# Patient Record
Sex: Female | Born: 1935 | Race: White | Hispanic: No | Smoking: Never smoker
Health system: Southern US, Community
[De-identification: ages and names within clinical notes are randomized; demographics above are authoritative.]

## PROBLEM LIST (undated history)

## (undated) DIAGNOSIS — Z9889 Other specified postprocedural states: Secondary | ICD-10-CM

## (undated) DIAGNOSIS — I350 Nonrheumatic aortic (valve) stenosis: Secondary | ICD-10-CM

## (undated) DIAGNOSIS — L900 Lichen sclerosus et atrophicus: Secondary | ICD-10-CM

## (undated) DIAGNOSIS — R0600 Dyspnea, unspecified: Secondary | ICD-10-CM

## (undated) DIAGNOSIS — R0609 Other forms of dyspnea: Secondary | ICD-10-CM

## (undated) DIAGNOSIS — I83813 Varicose veins of bilateral lower extremities with pain: Secondary | ICD-10-CM

## (undated) DIAGNOSIS — N390 Urinary tract infection, site not specified: Secondary | ICD-10-CM

## (undated) DIAGNOSIS — R413 Other amnesia: Secondary | ICD-10-CM

## (undated) DIAGNOSIS — R112 Nausea with vomiting, unspecified: Secondary | ICD-10-CM

## (undated) DIAGNOSIS — G4700 Insomnia, unspecified: Secondary | ICD-10-CM

## (undated) DIAGNOSIS — Z8739 Personal history of other diseases of the musculoskeletal system and connective tissue: Secondary | ICD-10-CM

## (undated) DIAGNOSIS — G8929 Other chronic pain: Secondary | ICD-10-CM

## (undated) DIAGNOSIS — M1990 Unspecified osteoarthritis, unspecified site: Secondary | ICD-10-CM

## (undated) DIAGNOSIS — H269 Unspecified cataract: Secondary | ICD-10-CM

## (undated) DIAGNOSIS — E7800 Pure hypercholesterolemia, unspecified: Secondary | ICD-10-CM

## (undated) DIAGNOSIS — K219 Gastro-esophageal reflux disease without esophagitis: Secondary | ICD-10-CM

## (undated) DIAGNOSIS — Q438 Other specified congenital malformations of intestine: Secondary | ICD-10-CM

## (undated) DIAGNOSIS — M545 Low back pain, unspecified: Secondary | ICD-10-CM

## (undated) DIAGNOSIS — E039 Hypothyroidism, unspecified: Secondary | ICD-10-CM

## (undated) HISTORY — DX: Nonrheumatic aortic (valve) stenosis: I35.0

## (undated) HISTORY — DX: Other forms of dyspnea: R06.09

## (undated) HISTORY — DX: Other chronic pain: G89.29

## (undated) HISTORY — DX: Low back pain, unspecified: M54.50

## (undated) HISTORY — DX: Unspecified cataract: H26.9

## (undated) HISTORY — DX: Other amnesia: R41.3

## (undated) HISTORY — PX: ENDOSCOPIC VEIN LASER TREATMENT: SHX1508

## (undated) HISTORY — DX: Dyspnea, unspecified: R06.00

## (undated) HISTORY — DX: Hypothyroidism, unspecified: E03.9

## (undated) HISTORY — DX: Unspecified osteoarthritis, unspecified site: M19.90

## (undated) HISTORY — DX: Varicose veins of bilateral lower extremities with pain: I83.813

## (undated) HISTORY — DX: Personal history of other diseases of the musculoskeletal system and connective tissue: Z87.39

## (undated) HISTORY — PX: FOOT NEUROMA SURGERY: SHX646

## (undated) HISTORY — DX: Gastro-esophageal reflux disease without esophagitis: K21.9

## (undated) HISTORY — PX: EYE MUSCLE SURGERY: SHX370

## (undated) HISTORY — DX: Other specified congenital malformations of intestine: Q43.8

## (undated) HISTORY — DX: Pure hypercholesterolemia, unspecified: E78.00

## (undated) HISTORY — DX: Lichen sclerosus et atrophicus: L90.0

## (undated) HISTORY — DX: Urinary tract infection, site not specified: N39.0

---
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
## 1974-05-13 HISTORY — PX: CHOLECYSTECTOMY: SHX55

## 1998-03-28 ENCOUNTER — Other Ambulatory Visit: Admission: RE | Admit: 1998-03-28 | Discharge: 1998-03-28 | Payer: Self-pay | Admitting: Obstetrics and Gynecology

## 1998-05-13 HISTORY — PX: TOTAL HIP ARTHROPLASTY: SHX124

## 1998-05-17 ENCOUNTER — Encounter: Payer: Self-pay | Admitting: Emergency Medicine

## 1998-05-18 ENCOUNTER — Inpatient Hospital Stay (HOSPITAL_COMMUNITY): Admission: EM | Admit: 1998-05-18 | Discharge: 1998-05-23 | Payer: Self-pay | Admitting: Emergency Medicine

## 1998-05-18 ENCOUNTER — Encounter: Payer: Self-pay | Admitting: Orthopedic Surgery

## 1998-05-25 ENCOUNTER — Encounter (HOSPITAL_COMMUNITY): Admission: RE | Admit: 1998-05-25 | Discharge: 1998-08-23 | Payer: Self-pay | Admitting: Orthopedic Surgery

## 1999-03-09 ENCOUNTER — Other Ambulatory Visit: Admission: RE | Admit: 1999-03-09 | Discharge: 1999-03-09 | Payer: Self-pay | Admitting: *Deleted

## 1999-03-29 ENCOUNTER — Encounter (INDEPENDENT_AMBULATORY_CARE_PROVIDER_SITE_OTHER): Payer: Self-pay

## 1999-03-29 ENCOUNTER — Other Ambulatory Visit: Admission: RE | Admit: 1999-03-29 | Discharge: 1999-03-29 | Payer: Self-pay | Admitting: *Deleted

## 2000-04-23 ENCOUNTER — Other Ambulatory Visit: Admission: RE | Admit: 2000-04-23 | Discharge: 2000-04-23 | Payer: Self-pay | Admitting: *Deleted

## 2002-12-06 ENCOUNTER — Other Ambulatory Visit: Admission: RE | Admit: 2002-12-06 | Discharge: 2002-12-06 | Payer: Self-pay | Admitting: Obstetrics & Gynecology

## 2003-12-28 ENCOUNTER — Ambulatory Visit (HOSPITAL_COMMUNITY): Admission: RE | Admit: 2003-12-28 | Discharge: 2003-12-28 | Payer: Self-pay | Admitting: Gastroenterology

## 2008-12-14 ENCOUNTER — Encounter: Admission: RE | Admit: 2008-12-14 | Discharge: 2008-12-14 | Payer: Self-pay | Admitting: Gastroenterology

## 2009-09-14 ENCOUNTER — Emergency Department (HOSPITAL_COMMUNITY): Admission: EM | Admit: 2009-09-14 | Discharge: 2009-09-14 | Payer: Self-pay | Admitting: Emergency Medicine

## 2009-11-14 ENCOUNTER — Encounter: Admission: RE | Admit: 2009-11-14 | Discharge: 2009-12-08 | Payer: Self-pay | Admitting: Orthopaedic Surgery

## 2010-09-28 NOTE — Op Note (Signed)
NAME:  Summer Davis, Summer Davis NO.:  [id number]   MEDICAL RECORD NO.:  [id number]                   PATIENT TYPE:  AMB   LOCATION:  ENDO                                 FACILITY:  [facility]   PHYSICIAN:  Graylin Shiver, M.D.                DATE OF BIRTH:  07/30/1930   DATE OF PROCEDURE:  12/28/2003  DATE OF DISCHARGE:                                 OPERATIVE REPORT   PROCEDURE:  Incomplete colonoscopy.   INDICATION:  Screening.   Informed consent was obtained after explanation of the risks of bleeding,  infection, perforation.   PREMEDICATIONS:  1. Fentanyl 70 mcg IV.  2. Versed 5 mg IV.   PROCEDURE:  With the patient in the left lateral decubitus position a rectal  exam was performed, no masses were felt.  The Olympus colonoscope was  inserted into the rectum and advanced into the sigmoid colon.  The sigmoid  was quite tortuous.  The scope was advanced further up.  The patient had to  be placed on her back to advance the scope further.  Pressure was applied to  various points in the abdomen.  The scope kept looping and it was difficult  to advance.  I think that the scope was advanced into the transverse colon  but it kept looping and despite all maneuvers I could not advance the scope  any further.  The scope was brought out, no abnormalities were seen.   IMPRESSION:  Incomplete colonoscopy.   PLAN:  Barium enema to evaluate the more proximal colon.                                               Graylin Shiver, M.D.    SFG/MEDQ  D:  12/28/2003  T:  12/28/2003  Job:  [id number]   cc:   Tally Joe, M.D.  [address] [place] [address]  [place], [place] [address]  Fax: [phone]

## 2011-05-14 DIAGNOSIS — R0609 Other forms of dyspnea: Secondary | ICD-10-CM

## 2011-05-14 DIAGNOSIS — R0600 Dyspnea, unspecified: Secondary | ICD-10-CM

## 2011-05-14 HISTORY — DX: Dyspnea, unspecified: R06.00

## 2011-05-14 HISTORY — DX: Other forms of dyspnea: R06.09

## 2011-10-04 ENCOUNTER — Ambulatory Visit (INDEPENDENT_AMBULATORY_CARE_PROVIDER_SITE_OTHER): Payer: Medicare Other | Admitting: Family Medicine

## 2011-10-04 ENCOUNTER — Ambulatory Visit: Payer: Medicare Other

## 2011-10-04 VITALS — BP 118/68 | HR 75 | Temp 98.0°F | Resp 16 | Ht 62.0 in | Wt 123.2 lb

## 2011-10-04 DIAGNOSIS — M419 Scoliosis, unspecified: Secondary | ICD-10-CM | POA: Insufficient documentation

## 2011-10-04 DIAGNOSIS — M4120 Other idiopathic scoliosis, site unspecified: Secondary | ICD-10-CM

## 2011-10-04 DIAGNOSIS — M25559 Pain in unspecified hip: Secondary | ICD-10-CM

## 2011-10-04 DIAGNOSIS — M1610 Unilateral primary osteoarthritis, unspecified hip: Secondary | ICD-10-CM | POA: Insufficient documentation

## 2011-10-04 DIAGNOSIS — S39012A Strain of muscle, fascia and tendon of lower back, initial encounter: Secondary | ICD-10-CM

## 2011-10-04 DIAGNOSIS — M549 Dorsalgia, unspecified: Secondary | ICD-10-CM

## 2011-10-04 DIAGNOSIS — IMO0002 Reserved for concepts with insufficient information to code with codable children: Secondary | ICD-10-CM

## 2011-10-04 MED ORDER — METAXALONE 800 MG PO TABS: ORAL_TABLET | ORAL | Status: DC

## 2011-10-04 NOTE — Patient Instructions (Addendum)
Rest.  Light exercise.  Return if worse.  Take muscle relaxant 3 times daily 1/2 pill.  Continue meloxicam.

## 2011-10-04 NOTE — Progress Notes (Signed)
Subjective: 78 year old lady with history of some scoliosis all her life and a left hip or placement. Yesterday she was working in her yard bending over pulling weeds and other activities. At the end of the time she raised up picking up some of her tools. At that time she noted severe pain in the right hip. It continues to hurt her a good deal today. No radiculopathy. She did take some meloxicam this morning. A couple of weeks ago helping a neighbor she had had a similar but milder upset.  Objective: Lumbar scoliosis is readily visible with asymmetry of the creases of her skin in the low back. The hips tilt. There is tenderness in the low lumbar region, but especially tender in the SI joint into the right hip. No tenderness anteriorly in the tip joint. No tenderness at the greater trochanter area. Straight leg raising test seated is negative.  Assessment: Back pain and strain, primarily SI joint area.   Plan: We'll get x-rays & treatment accordingly  [facility] reading (PRIMARY) by  Dr. Alwyn Ren  Scoliosis with marked degeneration of the lower lumbar spine. Arthritic narrowing of right hip joint superiorly. old appliance left hip.      Marland Kitchen

## 2012-01-01 DIAGNOSIS — IMO0002 Reserved for concepts with insufficient information to code with codable children: Secondary | ICD-10-CM | POA: Insufficient documentation

## 2012-02-11 HISTORY — PX: NM MYOVIEW LTD: HXRAD82

## 2012-02-28 ENCOUNTER — Ambulatory Visit (INDEPENDENT_AMBULATORY_CARE_PROVIDER_SITE_OTHER): Payer: Medicare Other | Admitting: Cardiology

## 2012-02-28 ENCOUNTER — Encounter: Payer: Self-pay | Admitting: Cardiology

## 2012-02-28 VITALS — BP 156/69 | HR 76 | Ht 63.0 in | Wt 122.8 lb

## 2012-02-28 DIAGNOSIS — R079 Chest pain, unspecified: Secondary | ICD-10-CM

## 2012-02-28 NOTE — Progress Notes (Signed)
HPI The patient presents for evaluation of chest discomfort. She's had a long history of discomfort that she has ascribed to possible reflux in the past. This has been a pain in her left upper chest it comes sporadically. However, earlier this month she was with chest discomfort. This was lower and radiating across both sides of her chest area did he woke in the morning with this. She doesn't think she had any discomfort like this previously. She didn't have any jaw or arm discomfort. She didn't have any nausea vomiting or diaphoresis. She presented to [facility], MD's office and received GI cocktail. She actually tells me she had no relief though she told the staff she had relief because she wanted to go home. She's not had any of this discomfort again though she's continued to have a left upper chest discomfort. She has been having some shortness of breath walking up a slight incline to her neighbor's house. She's also noticed increasing fatigue and decreasing exercise tolerance. She does not report any prior cardiac history or workup. She does not get chest pressure, neck or arm discomfort. She does not get palpitations, syncope near syncope. She has no PND or orthopnea. Of note she was recently taken off of Zetia which she thinks might help slightly with her fatigue.  Allergies  Allergen Reactions  . Oxycodone     Current Outpatient Prescriptions  Medication Sig Dispense Refill  . aspirin 81 MG tablet Take 81 mg by mouth daily.      . calcium carbonate 1250 MG capsule Take 1,250 mg by mouth 2 (two) times daily with a meal.      . gabapentin (NEURONTIN) 300 MG capsule Take 300 mg by mouth 3 (three) times daily.      Marland Kitchen levothyroxine (SYNTHROID, LEVOTHROID) 75 MCG tablet Take 75 mcg by mouth every other day.      . levothyroxine (SYNTHROID, LEVOTHROID) 88 MCG tablet Take 88 mcg by mouth every other day.      . Multiple Vitamin (MULTIVITAMIN) tablet Take 1 tablet by mouth daily.      Marland Kitchen  omeprazole (PRILOSEC) 20 MG capsule Take 20 mg by mouth daily.        Past Medical History  Diagnosis Date  . H/O scoliosis   . GERD (gastroesophageal reflux disease)   . Arthritis   . Hypercholesterolemia   . Hypothyroidism   . UTI (lower urinary tract infection)   . Tortuous colon     Past Surgical History  Procedure Date  . Total hip arthroplasty     Left  . Cholecystectomy   . Tonsillectomy and adenoidectomy   . Endoscopic vein laser treatment     Family History  Problem Relation Age of Onset  . Heart disease Father     Later onset CAD and heart failure  . Cancer Mother     Liver and pancreas    History   Social History  . Marital Status: Married    Spouse Name: N/A    Number of Children: 4  . Years of Education: N/A   Occupational History  . Not on file.   Social History Main Topics  . Smoking status: Never Smoker   . Smokeless tobacco: Not on file  . Alcohol Use: Not on file  . Drug Use: Not on file  . Sexually Active: Not on file   Other Topics Concern  . Not on file   Social History Narrative   Lives with husband.  Second  husband.  One child is deceased.      ROS:  Positive for joint pains.  Otherwise as stated in the HPI and negative for all other systems.   PHYSICAL EXAM BP 156/69  Pulse 76  Ht 5\' 3"  (1.6 m)  Wt 122 lb 12.8 oz (55.702 kg)  BMI 21.75 kg/m2 GENERAL:  Well appearing HEENT:  Pupils equal round and reactive, fundi not visualized, oral mucosa unremarkable NECK:  No jugular venous distention, waveform within normal limits, carotid upstroke brisk and symmetric, no bruits, no thyromegaly LYMPHATICS:  No cervical, inguinal adenopathy LUNGS:  Clear to auscultation bilaterally BACK:  No CVA tenderness CHEST:  Unremarkable HEART:  PMI not displaced or sustained,S1 and S2 within normal limits, no S3, no S4, no clicks, no rubs, no murmurs ABD:  Flat, positive bowel sounds normal in frequency in pitch, no bruits, no rebound, no  guarding, no midline pulsatile mass, no hepatomegaly, no splenomegaly EXT:  2 plus pulses throughout, no edema, no cyanosis no clubbing SKIN:  No rashes no nodules NEURO:  Cranial nerves II through XII grossly intact, motor grossly intact throughout PSYCH:  Cognitively intact, oriented to person place and time  EKG:  Sinus rhythm, rate 76 axis within normal limits, intervals within normal limits, no acute ST-T wave changes.  ASSESSMENT AND PLAN  Chest pain - The patient's chest pain is somewhat atypical. However, given this and the decreased exercise tolerance is certainly could be an anginal equivalent. Stress testing is indicated. However, she does not think she would be a little walk on a treadmill. Therefore, she will have a [employer].  Dyslipidemia - Her LDL was 113 recently. However, the HDL was 59. This is a reasonable level and ratio in the absence of known coronary disease.

## 2012-02-28 NOTE — Patient Instructions (Addendum)
Your physician recommends that you continue on your current medications as directed. Please refer to the Current Medication list given to you today. Your physician has requested that you have a lexiscan myoview. For further information please visit [email or web address]. Please follow instruction sheet, as given.  

## 2012-03-05 ENCOUNTER — Ambulatory Visit (HOSPITAL_COMMUNITY): Payer: Medicare Other | Attending: Cardiovascular Disease | Admitting: Radiology

## 2012-03-05 VITALS — BP 146/79 | HR 75 | Ht 63.0 in | Wt 122.0 lb

## 2012-03-05 DIAGNOSIS — R000 Tachycardia, unspecified: Secondary | ICD-10-CM | POA: Insufficient documentation

## 2012-03-05 DIAGNOSIS — R0609 Other forms of dyspnea: Secondary | ICD-10-CM | POA: Insufficient documentation

## 2012-03-05 DIAGNOSIS — R0789 Other chest pain: Secondary | ICD-10-CM | POA: Insufficient documentation

## 2012-03-05 DIAGNOSIS — Z8249 Family history of ischemic heart disease and other diseases of the circulatory system: Secondary | ICD-10-CM | POA: Insufficient documentation

## 2012-03-05 DIAGNOSIS — R002 Palpitations: Secondary | ICD-10-CM | POA: Insufficient documentation

## 2012-03-05 DIAGNOSIS — R0989 Other specified symptoms and signs involving the circulatory and respiratory systems: Secondary | ICD-10-CM | POA: Insufficient documentation

## 2012-03-05 DIAGNOSIS — R079 Chest pain, unspecified: Secondary | ICD-10-CM

## 2012-03-05 DIAGNOSIS — R0602 Shortness of breath: Secondary | ICD-10-CM

## 2012-03-05 MED ORDER — REGADENOSON 0.4 MG/5ML IV SOLN
0.4000 mg | Freq: Once | INTRAVENOUS | Status: AC
  Administered 2012-03-05: 0.4 mg via INTRAVENOUS

## 2012-03-05 MED ORDER — TECHNETIUM TC 99M SESTAMIBI GENERIC - CARDIOLITE
33.0000 | Freq: Once | INTRAVENOUS | Status: AC | PRN
  Administered 2012-03-05: 33 via INTRAVENOUS

## 2012-03-05 MED ORDER — TECHNETIUM TC 99M SESTAMIBI GENERIC - CARDIOLITE
11.0000 | Freq: Once | INTRAVENOUS | Status: AC | PRN
  Administered 2012-03-05: 11 via INTRAVENOUS

## 2012-03-05 NOTE — Progress Notes (Signed)
[facility] SITE 3 NUCLEAR MED [address] [id number] [place] [address] [phone]  Cardiology Nuclear Med Study  Summer Davis is a 78 y.o. female     MRN : [id number]     DOB: 1930/05/26  Procedure Date: 03/05/2012  Nuclear Med Background Indication for Stress Test:  Evaluation for Ischemia History:  No previously documented CAD. Cardiac Risk Factors: Family History - CAD  Symptoms:  Chest Pain/Pressure>Back.  (last episode of chest discomfort is now, 2-3/10), DOE, Fatigue, Nausea, Palpitations and Rapid HR   Nuclear Pre-Procedure Caffeine/Decaff Intake:  None NPO After: 6:30am   Lungs:  Clear. O2 Sat: 97% on room air. IV 0.9% NS with Angio Cath:  22g  IV Site: R Antecubital  IV Started by:  Bonnita Levan, RN  Chest Size (in):  34 Cup Size: B  Height: 5\' 3"  (1.6 m)  Weight:  122 lb (55.339 kg)  BMI:  Body mass index is 21.61 kg/(m^2). Tech Comments:  N/A    Nuclear Med Study 1 or 2 day study: 1 day  Stress Test Type:  Lexiscan  Reading MD: Kristeen Miss, MD  Order Authorizing Provider:  Rollene Rotunda, MD  Resting Radionuclide: Technetium 25m Sestamibi  Resting Radionuclide Dose: 11.0 mCi   Stress Radionuclide:  Technetium 73m Sestamibi  Stress Radionuclide Dose: 33.0 mCi           Stress Protocol Rest HR: 75 Stress HR: 118  Rest BP: 146/79 Stress BP: 171/71  Exercise Time (min): n/a METS: n/a   Predicted Max HR: 141 bpm % Max HR: 83.69 bpm Rate Pressure Product: 03474   Dose of Adenosine (mg):  n/a Dose of Lexiscan: 0.4 mg  Dose of Atropine (mg): n/a Dose of Dobutamine: n/a mcg/kg/min (at max HR)  Stress Test Technologist: Smiley Houseman, CMA-N  Nuclear Technologist:  Domenic Polite, CNMT     Rest Procedure:  Myocardial perfusion imaging was performed at rest 45 minutes following the intravenous administration of Technetium 13m Sestamibi.  Rest ECG: No acute changes.  Stress Procedure:  The patient received IV Lexiscan 0.4 mg  over 15-seconds.  Technetium 55m Sestamibi was injected at 30-seconds.  There were no significant changes with Lexiscan, occasional PAC's were noted.  Quantitative spect images were obtained after a 45 minute delay.  Stress ECG: No significant change from baseline ECG  QPS Raw Data Images:  Normal; no motion artifact; normal heart/lung ratio. Stress Images:  Normal homogeneous uptake in all areas of the myocardium. Rest Images:  Normal homogeneous uptake in all areas of the myocardium. Subtraction (SDS):  No evidence of ischemia. Transient Ischemic Dilatation (Normal <1.22):  1.36 Lung/Heart Ratio (Normal <0.45):  0.28  Quantitative Gated Spect Images QGS EDV:  57 ml QGS ESV:  12 ml  Impression Exercise Capacity:  Lexiscan with no exercise. BP Response:  Normal blood pressure response. Clinical Symptoms:  No significant symptoms noted. ECG Impression:  No significant ST segment change suggestive of ischemia. Comparison with Prior Nuclear Study: No previous nuclear study performed  Overall Impression:  Normal stress nuclear study.  No evidence of ischemia.  Normal LV function LV Ejection Fraction: 78%.  LV Wall Motion:  Normal Wall Motion.    Vesta Mixer, Montez Hageman., MD, [facility] 03/05/2012, 5:46 PM Office - [phone] Pager [phone]

## 2012-03-06 ENCOUNTER — Telehealth: Payer: Self-pay | Admitting: Cardiology

## 2012-03-06 NOTE — Telephone Encounter (Signed)
Follow-up:     Patient called wanting to speak with the person who called her initially.  Please call back.

## 2012-03-06 NOTE — Telephone Encounter (Signed)
Reviewed results of testing with pt who states understanding. 

## 2012-03-06 NOTE — Telephone Encounter (Signed)
Pt returning nurse call, she can be reached at hM#  °

## 2012-03-11 NOTE — Addendum Note (Signed)
Addended by: Reine Just on: 03/11/2012 04:09 PM   Modules accepted: Orders

## 2012-11-06 ENCOUNTER — Encounter (HOSPITAL_COMMUNITY): Payer: Self-pay | Admitting: Pharmacy Technician

## 2012-11-10 ENCOUNTER — Other Ambulatory Visit (HOSPITAL_COMMUNITY): Payer: Self-pay | Admitting: Orthopedic Surgery

## 2012-11-10 NOTE — Progress Notes (Signed)
EKG, Nuclear Stress Test 10/13 EPIC,   Clearance with LOV Dr Azucena Cecil 11/05/12 on chart, CBC, Cmet 11/05/12 on chart

## 2012-11-10 NOTE — Patient Instructions (Addendum)
[name]  11/10/2012   Your procedure is scheduled on:  11/17/12  TUESDAY  Report to [facility] at   [phone]    AM.  Call this number if you have problems the morning of surgery: [phone]       Remember:   Do not eat food  Or drink :After Midnight. Monday NIGHT   Take these medicines the morning of surgery with A SIP OF WATER: Levothyroxine, Neurontin   .  Contacts, dentures or partial plates can not be worn to surgery  Leave suitcase in the car. After surgery it may be brought to your room.  For patients admitted to the hospital, checkout time is 11:00 AM day of  discharge.             SPECIAL INSTRUCTIONS- SEE [place] PREPARING FOR SURGERY INSTRUCTION SHEET-     DO NOT WEAR JEWELRY, LOTIONS, POWDERS, OR PERFUMES.  WOMEN-- DO NOT SHAVE LEGS OR UNDERARMS FOR 12 HOURS BEFORE SHOWERS. MEN MAY SHAVE FACE.  Patients discharged the day of surgery will not be allowed to drive home. IF going home the day of surgery, you must have a driver and someone to stay with you for the first 24 hours  Name and phone number of your driver: ADMISSION                                                                       Please read over the following fact sheets that you were given: MRSA Information, Incentive Spirometry Sheet, Blood Transfusion Sheet  Information                                                                                   [name]  PST 336  [phone]                 FAILURE TO FOLLOW THESE INSTRUCTIONS MAY RESULT IN  CANCELLATION   OF YOUR SURGERY                                                  Patient Signature _____________________________

## 2012-11-11 ENCOUNTER — Encounter (HOSPITAL_COMMUNITY)
Admission: RE | Admit: 2012-11-11 | Discharge: 2012-11-11 | Disposition: A | Discharge status: Home / Self Care | Payer: Medicare Other | Source: Ambulatory Visit | Attending: Orthopedic Surgery | Admitting: Orthopedic Surgery

## 2012-11-11 ENCOUNTER — Encounter (HOSPITAL_COMMUNITY): Payer: Self-pay

## 2012-11-11 ENCOUNTER — Ambulatory Visit (HOSPITAL_COMMUNITY)
Admission: RE | Admit: 2012-11-11 | Discharge: 2012-11-11 | Disposition: A | Discharge status: Home / Self Care | Payer: Medicare Other | Source: Ambulatory Visit | Attending: Orthopedic Surgery | Admitting: Orthopedic Surgery

## 2012-11-11 DIAGNOSIS — Z01812 Encounter for preprocedural laboratory examination: Secondary | ICD-10-CM | POA: Insufficient documentation

## 2012-11-11 DIAGNOSIS — Z01818 Encounter for other preprocedural examination: Secondary | ICD-10-CM | POA: Insufficient documentation

## 2012-11-11 DIAGNOSIS — M1710 Unilateral primary osteoarthritis, unspecified knee: Secondary | ICD-10-CM | POA: Insufficient documentation

## 2012-11-11 DIAGNOSIS — IMO0002 Reserved for concepts with insufficient information to code with codable children: Secondary | ICD-10-CM | POA: Insufficient documentation

## 2012-11-11 HISTORY — DX: Insomnia, unspecified: G47.00

## 2012-11-11 HISTORY — DX: Nausea with vomiting, unspecified: R11.2

## 2012-11-11 HISTORY — DX: Other specified postprocedural states: Z98.890

## 2012-11-11 LAB — URINALYSIS, ROUTINE W REFLEX MICROSCOPIC
Bilirubin Urine: NEGATIVE
Glucose, UA: NEGATIVE mg/dL
Hgb urine dipstick: NEGATIVE
Ketones, ur: NEGATIVE mg/dL
Leukocytes, UA: NEGATIVE
Nitrite: NEGATIVE
Protein, ur: NEGATIVE mg/dL
Specific Gravity, Urine: 1.013 (ref 1.005–1.030)
Urobilinogen, UA: 0.2 mg/dL (ref 0.0–1.0)
pH: 7.5 (ref 5.0–8.0)

## 2012-11-11 LAB — ABO/RH: ABO/RH(D): A POS

## 2012-11-11 LAB — SURGICAL PCR SCREEN
MRSA, PCR: NEGATIVE
Staphylococcus aureus: POSITIVE — AB

## 2012-11-11 LAB — APTT: aPTT: 33 seconds (ref 24–37)

## 2012-11-11 LAB — PROTIME-INR
INR: 0.9 (ref 0.00–1.49)
Prothrombin Time: 12 seconds (ref 11.6–15.2)

## 2012-11-15 NOTE — H&P (Signed)
TOTAL KNEE ADMISSION H&P  Patient is being admitted for right total knee arthroplasty.  Subjective:  Chief Complaint:  Right knee OA / pain.  HPI: Summer Davis, 79 y.o. female, has a history of pain and functional disability in the right knee due to arthritis and has failed non-surgical conservative treatments for greater than 12 weeks to includeNSAID's and/or analgesics, corticosteriod injections, viscosupplementation injections and activity modification.  Onset of symptoms was gradual, starting 3+ years ago with gradually worsening course since that time. The patient noted prior procedures on the knee to include  arthroscopy on the right knee(s).  Patient currently rates pain in the right knee(s) at 8 out of 10 with activity. Patient has worsening of pain with activity and weight bearing, pain that interferes with activities of daily living, pain with passive range of motion, crepitus and joint swelling.  Patient has evidence of periarticular osteophytes and joint space narrowing by imaging studies. There is no active signs of infection.  Risks, benefits and expectations were discussed with the patient. Patient understand the risks, benefits and expectations and wishes to proceed with surgery.   D/C Plans:   Home with HHPT  Post-op Meds:   Rx given for ASA, Zanaflex, Celebrex, Iron, Colace and MiraLax  Tranexamic Acid:   To be given  Decadron:    To be given  FYI:    ASA post-op  Norco for pain   Patient Active Problem List   Diagnosis Date Noted  . Chest pain 02/28/2012  . Arthritis, hip 10/04/2011  . Scoliosis 10/04/2011   Past Medical History  Diagnosis Date  . H/O scoliosis   . GERD (gastroesophageal reflux disease)   . Arthritis   . Hypercholesterolemia   . Hypothyroidism   . UTI (lower urinary tract infection)   . Tortuous colon   . Peripheral vascular disease     h/x varicose veins  . Insomnia   . PONV (postoperative nausea and vomiting)     Past Surgical History   Procedure Laterality Date  . Total hip arthroplasty      Left  . Cholecystectomy    . Tonsillectomy and adenoidectomy    . Endoscopic vein laser treatment    . Eye muscle surgery Left   . Foot neuroma surgery Right     Allergies  Allergen Reactions  . Oxycodone Nausea And Vomiting    History  Substance Use Topics  . Smoking status: Never Smoker   . Smokeless tobacco: Never Used  . Alcohol Use: No    Family History  Problem Relation Age of Onset  . Heart disease Father     Later onset CAD and heart failure  . Cancer Mother     Liver and pancreas     Review of Systems  Constitutional: Negative.   Eyes: Negative.   Respiratory: Positive for shortness of breath (on exertion).   Cardiovascular: Negative.   Gastrointestinal: Positive for heartburn.  Genitourinary: Positive for frequency.  Musculoskeletal: Positive for myalgias, back pain and joint pain.  Skin: Negative.   Neurological: Positive for headaches.  Endo/Heme/Allergies: Negative.   Psychiatric/Behavioral: Positive for memory loss. The patient has insomnia.     Objective:  Physical Exam  Constitutional: She is oriented to person, place, and time. She appears well-developed and well-nourished.  HENT:  Head: Normocephalic and atraumatic.  Mouth/Throat: Oropharynx is clear and moist. She has dentures.  Eyes: Pupils are equal, round, and reactive to light.  Neck: Neck supple. No JVD present. No tracheal deviation present.  No thyromegaly present.  Cardiovascular: Normal rate, regular rhythm, normal heart sounds and intact distal pulses.   Respiratory: Effort normal and breath sounds normal. No stridor. No respiratory distress. She has no wheezes.  GI: Soft. There is no tenderness. There is no guarding.  Lymphadenopathy:    She has no cervical adenopathy.  Neurological: She is alert and oriented to person, place, and time.  Skin: Skin is warm and dry.  Psychiatric: She has a normal mood and affect.     Labs:  Estimated body mass index is 21.62 kg/(m^2) as calculated from the following:   Height as of 03/05/12: 5\' 3"  (1.6 m).   Weight as of 03/05/12: 55.339 kg (122 lb).   Imaging Review Plain radiographs demonstrate severe degenerative joint disease of the right knee(s). The overall alignment is neutral. The bone quality appears to be good for age and reported activity level.  Assessment/Plan:  End stage arthritis, right knee   The patient history, physical examination, clinical judgment of the provider and imaging studies are consistent with end stage degenerative joint disease of the right knee(s) and total knee arthroplasty is deemed medically necessary. The treatment options including medical management, injection therapy arthroscopy and arthroplasty were discussed at length. The risks and benefits of total knee arthroplasty were presented and reviewed. The risks due to aseptic loosening, infection, stiffness, patella tracking problems, thromboembolic complications and other imponderables were discussed. The patient acknowledged the explanation, agreed to proceed with the plan and consent was signed. Patient is being admitted for inpatient treatment for surgery, pain control, PT, OT, prophylactic antibiotics, VTE prophylaxis, progressive ambulation and ADL's and discharge planning. The patient is planning to be discharged home with home health services.    Anastasio Auerbach Sofia Jaquith   PAC  11/15/2012, 10:47 AM

## 2012-11-16 NOTE — Progress Notes (Signed)
Pt notified of surgery time change to 11:30 am - instructed to arrive at 8:30 am - NPO after midnight

## 2012-11-17 ENCOUNTER — Encounter (HOSPITAL_COMMUNITY)
Admission: RE | Disposition: A | Discharge status: Home Health | Payer: Self-pay | Source: Ambulatory Visit | Attending: Orthopedic Surgery

## 2012-11-17 ENCOUNTER — Inpatient Hospital Stay (HOSPITAL_COMMUNITY): Payer: Medicare Other | Admitting: Anesthesiology

## 2012-11-17 ENCOUNTER — Inpatient Hospital Stay (HOSPITAL_COMMUNITY)
Admission: RE | Admit: 2012-11-17 | Discharge: 2012-11-19 | DRG: 470 | Disposition: A | Discharge status: Home Health | Payer: Medicare Other | Source: Ambulatory Visit | Attending: Orthopedic Surgery | Admitting: Orthopedic Surgery

## 2012-11-17 ENCOUNTER — Encounter (HOSPITAL_COMMUNITY): Payer: Self-pay | Admitting: Anesthesiology

## 2012-11-17 ENCOUNTER — Encounter (HOSPITAL_COMMUNITY): Payer: Self-pay | Admitting: *Deleted

## 2012-11-17 DIAGNOSIS — Z96659 Presence of unspecified artificial knee joint: Secondary | ICD-10-CM

## 2012-11-17 DIAGNOSIS — M4120 Other idiopathic scoliosis, site unspecified: Secondary | ICD-10-CM | POA: Diagnosis present

## 2012-11-17 DIAGNOSIS — Z96651 Presence of right artificial knee joint: Secondary | ICD-10-CM

## 2012-11-17 DIAGNOSIS — E039 Hypothyroidism, unspecified: Secondary | ICD-10-CM | POA: Diagnosis present

## 2012-11-17 DIAGNOSIS — I739 Peripheral vascular disease, unspecified: Secondary | ICD-10-CM | POA: Diagnosis present

## 2012-11-17 DIAGNOSIS — E7800 Pure hypercholesterolemia, unspecified: Secondary | ICD-10-CM | POA: Diagnosis present

## 2012-11-17 DIAGNOSIS — K219 Gastro-esophageal reflux disease without esophagitis: Secondary | ICD-10-CM | POA: Diagnosis present

## 2012-11-17 DIAGNOSIS — D62 Acute posthemorrhagic anemia: Secondary | ICD-10-CM | POA: Diagnosis not present

## 2012-11-17 DIAGNOSIS — M1710 Unilateral primary osteoarthritis, unspecified knee: Principal | ICD-10-CM | POA: Diagnosis present

## 2012-11-17 DIAGNOSIS — D500 Iron deficiency anemia secondary to blood loss (chronic): Secondary | ICD-10-CM | POA: Diagnosis not present

## 2012-11-17 HISTORY — PX: TOTAL KNEE ARTHROPLASTY: SHX125

## 2012-11-17 LAB — TYPE AND SCREEN
ABO/RH(D): A POS
Antibody Screen: NEGATIVE

## 2012-11-17 SURGERY — ARTHROPLASTY, KNEE, TOTAL
Anesthesia: Spinal | Site: Knee | Laterality: Right | Wound class: Clean

## 2012-11-17 MED ORDER — LEVOTHYROXINE SODIUM 75 MCG PO TABS
75.0000 ug | ORAL_TABLET | ORAL | Status: DC
  Administered 2012-11-19: 75 ug via ORAL
  Filled 2012-11-17: qty 1

## 2012-11-17 MED ORDER — ALUM & MAG HYDROXIDE-SIMETH 200-200-20 MG/5ML PO SUSP: 30.0000 mL | ORAL | Status: DC | PRN

## 2012-11-17 MED ORDER — DIPHENHYDRAMINE HCL 25 MG PO CAPS: 25.0000 mg | ORAL_CAPSULE | Freq: Four times a day (QID) | ORAL | Status: DC | PRN

## 2012-11-17 MED ORDER — METHOCARBAMOL 500 MG PO TABS
500.0000 mg | ORAL_TABLET | Freq: Four times a day (QID) | ORAL | Status: DC | PRN
  Administered 2012-11-17: 500 mg via ORAL
  Filled 2012-11-17: qty 1

## 2012-11-17 MED ORDER — METHOCARBAMOL 100 MG/ML IJ SOLN
500.0000 mg | Freq: Four times a day (QID) | INTRAVENOUS | Status: DC | PRN
  Administered 2012-11-17: 500 mg via INTRAVENOUS
  Filled 2012-11-17 (×2): qty 5

## 2012-11-17 MED ORDER — METOCLOPRAMIDE HCL 10 MG PO TABS: 5.0000 mg | ORAL_TABLET | Freq: Three times a day (TID) | ORAL | Status: DC | PRN

## 2012-11-17 MED ORDER — ZOLPIDEM TARTRATE 5 MG PO TABS: 5.0000 mg | ORAL_TABLET | Freq: Every evening | ORAL | Status: DC | PRN

## 2012-11-17 MED ORDER — METOCLOPRAMIDE HCL 5 MG/ML IJ SOLN
5.0000 mg | Freq: Three times a day (TID) | INTRAMUSCULAR | Status: DC | PRN
  Administered 2012-11-17 – 2012-11-18 (×2): 10 mg via INTRAVENOUS
  Filled 2012-11-17 (×2): qty 2

## 2012-11-17 MED ORDER — BISACODYL 10 MG RE SUPP: 10.0000 mg | Freq: Every day | RECTAL | Status: DC | PRN

## 2012-11-17 MED ORDER — ONDANSETRON HCL 4 MG PO TABS: 4.0000 mg | ORAL_TABLET | Freq: Four times a day (QID) | ORAL | Status: DC | PRN

## 2012-11-17 MED ORDER — PROMETHAZINE HCL 25 MG/ML IJ SOLN: 6.2500 mg | INTRAMUSCULAR | Status: DC | PRN

## 2012-11-17 MED ORDER — ONDANSETRON HCL 4 MG/2ML IJ SOLN
4.0000 mg | Freq: Four times a day (QID) | INTRAMUSCULAR | Status: DC | PRN
  Administered 2012-11-18 (×2): 4 mg via INTRAVENOUS
  Filled 2012-11-17 (×2): qty 2

## 2012-11-17 MED ORDER — POLYETHYL GLYCOL-PROPYL GLYCOL 0.4-0.3 % OP SOLN: 1.0000 [drp] | Freq: Every day | OPHTHALMIC | Status: DC | PRN

## 2012-11-17 MED ORDER — POLYVINYL ALCOHOL 1.4 % OP SOLN
1.0000 [drp] | OPHTHALMIC | Status: DC | PRN
  Filled 2012-11-17: qty 15

## 2012-11-17 MED ORDER — KETAMINE HCL 10 MG/ML IJ SOLN
INTRAMUSCULAR | Status: DC | PRN
  Administered 2012-11-17 (×2): 10 mg via INTRAVENOUS

## 2012-11-17 MED ORDER — CEFAZOLIN SODIUM-DEXTROSE 2-3 GM-% IV SOLR
2.0000 g | INTRAVENOUS | Status: AC
  Administered 2012-11-17: 2 g via INTRAVENOUS

## 2012-11-17 MED ORDER — ASPIRIN EC 325 MG PO TBEC
325.0000 mg | DELAYED_RELEASE_TABLET | Freq: Two times a day (BID) | ORAL | Status: DC
  Administered 2012-11-18 – 2012-11-19 (×3): 325 mg via ORAL
  Filled 2012-11-17 (×5): qty 1

## 2012-11-17 MED ORDER — FENTANYL CITRATE 0.05 MG/ML IJ SOLN
INTRAMUSCULAR | Status: DC | PRN
  Administered 2012-11-17 (×2): 50 ug via INTRAVENOUS

## 2012-11-17 MED ORDER — POLYETHYLENE GLYCOL 3350 17 G PO PACK
17.0000 g | PACK | Freq: Two times a day (BID) | ORAL | Status: DC
  Administered 2012-11-18 (×2): 17 g via ORAL

## 2012-11-17 MED ORDER — BUPIVACAINE IN DEXTROSE 0.75-8.25 % IT SOLN
INTRATHECAL | Status: DC | PRN
  Administered 2012-11-17: 1.5 mL via INTRATHECAL

## 2012-11-17 MED ORDER — ONDANSETRON HCL 4 MG/2ML IJ SOLN
INTRAMUSCULAR | Status: DC | PRN
  Administered 2012-11-17: 4 mg via INTRAVENOUS

## 2012-11-17 MED ORDER — DOCUSATE SODIUM 100 MG PO CAPS
100.0000 mg | ORAL_CAPSULE | Freq: Two times a day (BID) | ORAL | Status: DC
  Administered 2012-11-18 – 2012-11-19 (×3): 100 mg via ORAL

## 2012-11-17 MED ORDER — DEXAMETHASONE SODIUM PHOSPHATE 10 MG/ML IJ SOLN
10.0000 mg | Freq: Once | INTRAMUSCULAR | Status: AC
  Administered 2012-11-17: 10 mg via INTRAVENOUS

## 2012-11-17 MED ORDER — LEVOTHYROXINE SODIUM 88 MCG PO TABS
88.0000 ug | ORAL_TABLET | ORAL | Status: DC
  Administered 2012-11-18: 88 ug via ORAL
  Filled 2012-11-17 (×2): qty 1

## 2012-11-17 MED ORDER — BUPIVACAINE LIPOSOME 1.3 % IJ SUSP
INTRAMUSCULAR | Status: DC | PRN
  Administered 2012-11-17: 20 mL

## 2012-11-17 MED ORDER — HYDROCODONE-ACETAMINOPHEN 7.5-325 MG PO TABS
1.0000 | ORAL_TABLET | ORAL | Status: DC
  Administered 2012-11-17 – 2012-11-18 (×3): 1 via ORAL
  Administered 2012-11-18 (×2): 2 via ORAL
  Filled 2012-11-17 (×2): qty 2
  Filled 2012-11-17 (×2): qty 1
  Filled 2012-11-17: qty 2

## 2012-11-17 MED ORDER — DEXAMETHASONE SODIUM PHOSPHATE 10 MG/ML IJ SOLN
10.0000 mg | Freq: Once | INTRAMUSCULAR | Status: AC
  Administered 2012-11-18: 10 mg via INTRAVENOUS
  Filled 2012-11-17: qty 1

## 2012-11-17 MED ORDER — FLEET ENEMA 7-19 GM/118ML RE ENEM: 1.0000 | ENEMA | Freq: Once | RECTAL | Status: AC | PRN

## 2012-11-17 MED ORDER — KETOROLAC TROMETHAMINE 30 MG/ML IJ SOLN
INTRAMUSCULAR | Status: DC | PRN
  Administered 2012-11-17: 30 mg

## 2012-11-17 MED ORDER — SODIUM CHLORIDE 0.9 % IV SOLN
INTRAVENOUS | Status: DC
  Administered 2012-11-17 – 2012-11-18 (×2): via INTRAVENOUS
  Filled 2012-11-17 (×6): qty 1000

## 2012-11-17 MED ORDER — LACTATED RINGERS IV SOLN
INTRAVENOUS | Status: DC
  Administered 2012-11-17: 1000 mL via INTRAVENOUS
  Administered 2012-11-17 (×2): via INTRAVENOUS

## 2012-11-17 MED ORDER — PROPOFOL INFUSION 10 MG/ML OPTIME
INTRAVENOUS | Status: DC | PRN
  Administered 2012-11-17: 75 ug/kg/min via INTRAVENOUS

## 2012-11-17 MED ORDER — PHENOL 1.4 % MT LIQD: 1.0000 | OROMUCOSAL | Status: DC | PRN

## 2012-11-17 MED ORDER — SODIUM CHLORIDE 0.9 % IJ SOLN
INTRAMUSCULAR | Status: DC | PRN
  Administered 2012-11-17: 14 mL

## 2012-11-17 MED ORDER — BUPIVACAINE LIPOSOME 1.3 % IJ SUSP
20.0000 mL | Freq: Once | INTRAMUSCULAR | Status: AC
  Administered 2012-11-17: 25 mL
  Filled 2012-11-17: qty 20

## 2012-11-17 MED ORDER — HYDROMORPHONE HCL PF 1 MG/ML IJ SOLN: 0.5000 mg | INTRAMUSCULAR | Status: DC | PRN

## 2012-11-17 MED ORDER — CEFAZOLIN SODIUM-DEXTROSE 2-3 GM-% IV SOLR
2.0000 g | Freq: Four times a day (QID) | INTRAVENOUS | Status: AC
  Administered 2012-11-17 – 2012-11-18 (×2): 2 g via INTRAVENOUS
  Filled 2012-11-17 (×2): qty 50

## 2012-11-17 MED ORDER — MENTHOL 3 MG MT LOZG: 1.0000 | LOZENGE | OROMUCOSAL | Status: DC | PRN

## 2012-11-17 MED ORDER — CELECOXIB 200 MG PO CAPS
200.0000 mg | ORAL_CAPSULE | Freq: Two times a day (BID) | ORAL | Status: DC
  Administered 2012-11-18 (×2): 200 mg via ORAL
  Filled 2012-11-17 (×5): qty 1

## 2012-11-17 MED ORDER — FENTANYL CITRATE 0.05 MG/ML IJ SOLN: 25.0000 ug | INTRAMUSCULAR | Status: DC | PRN

## 2012-11-17 MED ORDER — FERROUS SULFATE 325 (65 FE) MG PO TABS
325.0000 mg | ORAL_TABLET | Freq: Three times a day (TID) | ORAL | Status: DC
  Administered 2012-11-18 – 2012-11-19 (×3): 325 mg via ORAL
  Filled 2012-11-17 (×8): qty 1

## 2012-11-17 MED ORDER — MIDAZOLAM HCL 5 MG/5ML IJ SOLN
INTRAMUSCULAR | Status: DC | PRN
  Administered 2012-11-17 (×2): 1 mg via INTRAVENOUS

## 2012-11-17 MED ORDER — TRANEXAMIC ACID 100 MG/ML IV SOLN
1000.0000 mg | Freq: Once | INTRAVENOUS | Status: AC
  Administered 2012-11-17: 1000 mg via INTRAVENOUS
  Filled 2012-11-17: qty 10

## 2012-11-17 MED ORDER — GABAPENTIN 300 MG PO CAPS
300.0000 mg | ORAL_CAPSULE | Freq: Every day | ORAL | Status: DC
  Administered 2012-11-18 – 2012-11-19 (×2): 300 mg via ORAL
  Filled 2012-11-17 (×2): qty 1

## 2012-11-17 SURGICAL SUPPLY — 55 items
BAG ZIPLOCK 12X15 (MISCELLANEOUS) ×2 IMPLANT
BANDAGE ELASTIC 6 VELCRO ST LF (GAUZE/BANDAGES/DRESSINGS) ×2 IMPLANT
BANDAGE ESMARK 6X9 LF (GAUZE/BANDAGES/DRESSINGS) ×1 IMPLANT
BLADE SAW SGTL 13.0X1.19X90.0M (BLADE) ×2 IMPLANT
BNDG ESMARK 6X9 LF (GAUZE/BANDAGES/DRESSINGS) ×2
BOWL SMART MIX CTS (DISPOSABLE) ×2 IMPLANT
CAPT RP KNEE ×2 IMPLANT
CEMENT HV SMART SET (Cement) ×4 IMPLANT
CLOTH BEACON ORANGE TIMEOUT ST (SAFETY) ×2 IMPLANT
CUFF TOURN SGL QUICK 34 (TOURNIQUET CUFF) ×1
CUFF TRNQT CYL 34X4X40X1 (TOURNIQUET CUFF) ×1 IMPLANT
DECANTER SPIKE VIAL GLASS SM (MISCELLANEOUS) ×2 IMPLANT
DERMABOND ADVANCED (GAUZE/BANDAGES/DRESSINGS) ×1
DERMABOND ADVANCED .7 DNX12 (GAUZE/BANDAGES/DRESSINGS) ×1 IMPLANT
DRAPE EXTREMITY T 121X128X90 (DRAPE) ×2 IMPLANT
DRAPE POUCH INSTRU U-SHP 10X18 (DRAPES) ×2 IMPLANT
DRAPE U-SHAPE 47X51 STRL (DRAPES) ×2 IMPLANT
DRSG AQUACEL AG ADV 3.5X10 (GAUZE/BANDAGES/DRESSINGS) ×2 IMPLANT
DRSG TEGADERM 4X4.75 (GAUZE/BANDAGES/DRESSINGS) ×2 IMPLANT
DURAPREP 26ML APPLICATOR (WOUND CARE) ×2 IMPLANT
ELECT REM PT RETURN 9FT ADLT (ELECTROSURGICAL) ×2
ELECTRODE REM PT RTRN 9FT ADLT (ELECTROSURGICAL) ×1 IMPLANT
EVACUATOR 1/8 PVC DRAIN (DRAIN) ×2 IMPLANT
FACESHIELD LNG OPTICON STERILE (SAFETY) ×10 IMPLANT
GAUZE SPONGE 2X2 8PLY STRL LF (GAUZE/BANDAGES/DRESSINGS) ×1 IMPLANT
GLOVE BIOGEL PI IND STRL 7.5 (GLOVE) ×1 IMPLANT
GLOVE BIOGEL PI IND STRL 8 (GLOVE) ×1 IMPLANT
GLOVE BIOGEL PI INDICATOR 7.5 (GLOVE) ×1
GLOVE BIOGEL PI INDICATOR 8 (GLOVE) ×1
GLOVE ECLIPSE 8.0 STRL XLNG CF (GLOVE) ×2 IMPLANT
GLOVE ORTHO TXT STRL SZ7.5 (GLOVE) ×4 IMPLANT
GOWN BRE IMP PREV XXLGXLNG (GOWN DISPOSABLE) ×2 IMPLANT
GOWN STRL NON-REIN LRG LVL3 (GOWN DISPOSABLE) ×2 IMPLANT
HANDPIECE INTERPULSE COAX TIP (DISPOSABLE) ×1
KIT BASIN OR (CUSTOM PROCEDURE TRAY) ×2 IMPLANT
MANIFOLD NEPTUNE II (INSTRUMENTS) ×2 IMPLANT
NDL SAFETY ECLIPSE 18X1.5 (NEEDLE) ×1 IMPLANT
NEEDLE HYPO 18GX1.5 SHARP (NEEDLE) ×1
NS IRRIG 1000ML POUR BTL (IV SOLUTION) ×4 IMPLANT
PACK TOTAL JOINT (CUSTOM PROCEDURE TRAY) ×2 IMPLANT
POSITIONER SURGICAL ARM (MISCELLANEOUS) ×2 IMPLANT
SET HNDPC FAN SPRY TIP SCT (DISPOSABLE) ×1 IMPLANT
SET PAD KNEE POSITIONER (MISCELLANEOUS) ×2 IMPLANT
SPONGE GAUZE 2X2 STER 10/PKG (GAUZE/BANDAGES/DRESSINGS) ×1
SUCTION FRAZIER 12FR DISP (SUCTIONS) ×2 IMPLANT
SUT MNCRL AB 4-0 PS2 18 (SUTURE) ×2 IMPLANT
SUT VIC AB 1 CT1 36 (SUTURE) ×2 IMPLANT
SUT VIC AB 2-0 CT1 27 (SUTURE) ×3
SUT VIC AB 2-0 CT1 TAPERPNT 27 (SUTURE) ×3 IMPLANT
SUT VLOC 180 0 24IN GS25 (SUTURE) ×2 IMPLANT
SYR 50ML LL SCALE MARK (SYRINGE) ×2 IMPLANT
TOWEL OR 17X26 10 PK STRL BLUE (TOWEL DISPOSABLE) ×4 IMPLANT
TRAY FOLEY CATH 14FRSI W/METER (CATHETERS) ×2 IMPLANT
WATER STERILE IRR 1500ML POUR (IV SOLUTION) ×2 IMPLANT
WRAP KNEE MAXI GEL POST OP (GAUZE/BANDAGES/DRESSINGS) ×2 IMPLANT

## 2012-11-17 NOTE — Anesthesia Postprocedure Evaluation (Signed)
  Anesthesia Post-op Note  Patient: Summer Davis  Procedure(s) Performed: Procedure(s) (LRB): RIGHT TOTAL KNEE ARTHROPLASTY (Right)  Patient Location: PACU  Anesthesia Type: Spinal  Level of Consciousness: awake and alert   Airway and Oxygen Therapy: Patient Spontanous Breathing  Post-op Pain: mild  Post-op Assessment: Post-op Vital signs reviewed, Patient's Cardiovascular Status Stable, Respiratory Function Stable, Patent Airway and No signs of Nausea or vomiting  Last Vitals:  Filed Vitals:   11/17/12 1523  BP: 130/67  Pulse: 74  Temp: 36.4 C  Resp: 19    Post-op Vital Signs: stable   Complications: No apparent anesthesia complications

## 2012-11-17 NOTE — Op Note (Signed)
NAME:  Summer Davis                      MEDICAL RECORD NO.:  [id number]                             FACILITY:  [facility]      PHYSICIAN:  Madlyn Frankel. Charlann Boxer, M.D.  DATE OF BIRTH:  [birth year]      DATE OF PROCEDURE:  11/17/2012                                     OPERATIVE REPORT         PREOPERATIVE DIAGNOSIS:  Right knee osteoarthritis.      POSTOPERATIVE DIAGNOSIS:  Right knee osteoarthritis.      FINDINGS:  The patient was noted to have complete loss of cartilage and   bone-on-bone arthritis with associated osteophytes in the lateral and patellofemoral compartments of   the knee with a significant synovitis and associated effusion.      PROCEDURE:  Right total knee replacement.      COMPONENTS USED:  DePuy rotating platform posterior stabilized knee   system, a size 4N femur, 3 tibia, 10 mm PS insert, and 38 patellar   button.      SURGEON:  Madlyn Frankel. Charlann Boxer, M.D.      ASSISTANT:  Lanney Gins, PA-C.      ANESTHESIA:  Spinal.      SPECIMENS:  None.      COMPLICATION:  None.      DRAINS:  One Hemovac.  EBL: <150cc      TOURNIQUET TIME:   Total Tourniquet Time Documented: Thigh (Right) - 38 minutes Total: Thigh (Right) - 38 minutes  .      The patient was stable to the recovery room.      INDICATION FOR PROCEDURE:  Summer Davis is a 79 y.o. female patient of   mine.  The patient had been seen, evaluated, and treated conservatively in the   office with medication, activity modification, and injections.  The patient had   radiographic changes of bone-on-bone arthritis with endplate sclerosis and osteophytes noted.      The patient failed conservative measures including medication, injections, and activity modification, and at this point was ready for more definitive measures.   Based on the radiographic changes and failed conservative measures, the patient   decided to proceed with total knee replacement.  Risks of infection,   DVT, component failure, need for  revision surgery, postop course, and   expectations were all   discussed and reviewed.  Consent was obtained for benefit of pain   relief.      PROCEDURE IN DETAIL:  The patient was brought to the operative theater.   Once adequate anesthesia, preoperative antibiotics, 2 gm of Ancef administered, the patient was positioned supine with the right thigh tourniquet placed.  The  right lower extremity was prepped and draped in sterile fashion.  A time-   out was performed identifying the patient, planned procedure, and   extremity.      The right lower extremity was placed in the [facility] leg holder.  The leg was   exsanguinated, tourniquet elevated to 250 mmHg.  A midline incision was   made followed by median parapatellar arthrotomy.  Following initial   exposure, attention was first directed  to the patella.  Precut   measurement was noted to be 21 mm.  I resected down to 13-14 mm and used a   38 patellar button to restore patellar height as well as cover the cut   surface.      The lug holes were drilled and a metal shim was placed to protect the   patella from retractors and saw blades.      At this point, attention was now directed to the femur.  The femoral   canal was opened with a drill, irrigated to try to prevent fat emboli.  An   intramedullary rod was passed at 5 degrees valgus, 10 mm of bone was   resected off the distal femur.  Following this resection, the tibia was   subluxated anteriorly.  Using the extramedullary guide, 6 mm of bone was resected off   the proximal lateral tibia.  We confirmed the gap would be   stable medially and laterally with a 10 mm insert as well as confirmed   the cut was perpendicular in the coronal plane, checking with an alignment rod.      Once this was done, I sized the femur to be a size 4 in the anterior-   posterior dimension, chose a narrow component based on medial and   lateral dimension.  The size 4 rotation block was then pinned in    position anterior referenced using the C-clamp to set rotation.  The   anterior, posterior, and  chamfer cuts were made without difficulty nor   notching making certain that I was along the anterior cortex to help   with flexion gap stability.      The final box cut was made off the lateral aspect of distal femur.      At this point, the tibia was sized to be a size 3, the size 3 tray was   then pinned in position through the medial third of the tubercle,   drilled, and keel punched.  Trial reduction was now carried with a 4N femur,  3 tibia, a 10 mm insert, and the 38 patella botton.  The knee was brought to   extension, full extension with good flexion stability with the patella   tracking through the trochlea without application of pressure.  Given   all these findings, the trial components removed.  Final components were   opened and cement was mixed.  The knee was irrigated with normal saline   solution and pulse lavage.  The synovial lining was   then injected with 0.25% Marcaine with epinephrine and 1 cc of Toradol,   total of 61 cc.      The knee was irrigated.  Final implants were then cemented onto clean and   dried cut surfaces of bone with the knee brought to extension with a 10 mm trial insert.      Once the cement had fully cured, the excess cement was removed   throughout the knee.  I confirmed I was satisfied with the range of   motion and stability, and the final 10 mm PS insert was chosen.  It was   placed into the knee.      The tourniquet had been let down at 38 minutes.  No significant   hemostasis required.  The medium Hemovac drain was placed deep.  The   extensor mechanism was then reapproximated using #1 Vicryl with the knee   in flexion.  The   remaining  wound was closed with 2-0 Vicryl and running 4-0 Monocryl.   The knee was cleaned, dried, dressed sterilely using Dermabond and   Aquacel dressing.  Drain site dressed separately.  The patient was then    brought to recovery room in stable condition, tolerating the procedure   well.   Please note that Physician Assistant, Lanney Gins, was present for the entirety of the case, and was utilized for pre-operative positioning, peri-operative retractor management, general facilitation of the procedure.  He was also utilized for primary wound closure at the end of the case.              Madlyn Frankel Charlann Boxer, M.D.

## 2012-11-17 NOTE — Anesthesia Preprocedure Evaluation (Signed)
Anesthesia Evaluation  Patient identified by MRN, date of birth, ID band Patient awake    Reviewed: Allergy & Precautions, H&P , NPO status , Patient's Chart, lab work & pertinent test results  Airway Mallampati: II TM Distance: >3 FB Neck ROM: Full    Dental no notable dental hx.    Pulmonary neg pulmonary ROS,  breath sounds clear to auscultation  Pulmonary exam normal       Cardiovascular Rhythm:Regular Rate:Normal     Neuro/Psych negative neurological ROS  negative psych ROS   GI/Hepatic negative GI ROS, Neg liver ROS,   Endo/Other  Hypothyroidism   Renal/GU negative Renal ROS  negative genitourinary   Musculoskeletal negative musculoskeletal ROS (+)   Abdominal   Peds negative pediatric ROS (+)  Hematology negative hematology ROS (+)   Anesthesia Other Findings   Reproductive/Obstetrics negative OB ROS                           Anesthesia Physical Anesthesia Plan  ASA: II  Anesthesia Plan: Spinal   Post-op Pain Management:    Induction:   Airway Management Planned: Nasal Cannula  Additional Equipment:   Intra-op Plan:   Post-operative Plan:   Informed Consent: I have reviewed the patients History and Physical, chart, labs and discussed the procedure including the risks, benefits and alternatives for the proposed anesthesia with the patient or authorized representative who has indicated his/her understanding and acceptance.     Plan Discussed with: CRNA and Surgeon  Anesthesia Plan Comments:         Anesthesia Quick Evaluation

## 2012-11-17 NOTE — Interval H&P Note (Signed)
History and Physical Interval Note:  11/17/2012 11:49 AM  Summer Davis  has presented today for surgery, with the diagnosis of right knee osteoarthritis  The various methods of treatment have been discussed with the patient and family. After consideration of risks, benefits and other options for treatment, the patient has consented to  Procedure(s): RIGHT TOTAL KNEE ARTHROPLASTY (Right) as a surgical intervention .  The patient's history has been reviewed, patient examined, no change in status, stable for surgery.  I have reviewed the patient's chart and labs.  Questions were answered to the patient's satisfaction.     [name]

## 2012-11-17 NOTE — Transfer of Care (Signed)
Immediate Anesthesia Transfer of Care Note  Patient: Summer Davis  Procedure(s) Performed: Procedure(s): RIGHT TOTAL KNEE ARTHROPLASTY (Right)  Patient Location: PACU  Anesthesia Type:Spinal  Level of Consciousness: awake, alert , oriented and patient cooperative  Airway & Oxygen Therapy: Patient Spontanous Breathing and Patient connected to face mask oxygen  Post-op Assessment: Report given to PACU RN, Post -op Vital signs reviewed and stable and Patient moving all extremities X 4  Post vital signs: stable  Complications: No apparent anesthesia complications  L2 level

## 2012-11-17 NOTE — Anesthesia Procedure Notes (Addendum)
Spinal  Patient location during procedure: OR End time: 11/17/2012 1:15 PM Staffing CRNA/Resident: Enriqueta Shutter Performed by: anesthesiologist and resident/CRNA  Preanesthetic Checklist Completed: patient identified, site marked, surgical consent, pre-op evaluation, timeout performed, IV checked, risks and benefits discussed and monitors and equipment checked Spinal Block Patient position: sitting Prep: Betadine Patient monitoring: heart rate, continuous pulse ox and blood pressure Approach: midline Location: L3-4 Injection technique: single-shot Needle Needle type: Sprotte  Needle gauge: 24 G Needle length: 9 cm Assessment Sensory level: T6 Additional Notes Expiration date of kit checked and confirmed. Patient tolerated procedure well, without complications.

## 2012-11-18 ENCOUNTER — Encounter (HOSPITAL_COMMUNITY): Payer: Self-pay | Admitting: Orthopedic Surgery

## 2012-11-18 LAB — BASIC METABOLIC PANEL
BUN: 8 mg/dL (ref 6–23)
CO2: 25 mEq/L (ref 19–32)
Calcium: 8.4 mg/dL (ref 8.4–10.5)
Chloride: 99 mEq/L (ref 96–112)
Creatinine, Ser: 0.48 mg/dL — ABNORMAL LOW (ref 0.50–1.10)
GFR calc Af Amer: >90 mL/min (ref >90)
GFR calc non Af Amer: >90 mL/min (ref >90)
Glucose, Bld: 142 mg/dL — ABNORMAL HIGH (ref 70–99)
Potassium: 4.3 mEq/L (ref 3.5–5.1)
Sodium: 134 mEq/L — ABNORMAL LOW (ref 135–145)

## 2012-11-18 LAB — CBC
HCT: 37.5 % (ref 36.0–46.0)
Hemoglobin: 12.3 g/dL (ref 12.0–15.0)
MCH: 27.9 pg (ref 26.0–34.0)
MCHC: 32.8 g/dL (ref 30.0–36.0)
MCV: 85 fL (ref 78.0–100.0)
Platelets: 183 10*3/uL (ref 150–400)
RBC: 4.41 MIL/uL (ref 3.87–5.11)
RDW: 13.1 % (ref 11.5–15.5)
WBC: 12.4 10*3/uL — ABNORMAL HIGH (ref 4.0–10.5)

## 2012-11-18 MED ORDER — ACETAMINOPHEN 500 MG PO TABS
1000.0000 mg | ORAL_TABLET | Freq: Four times a day (QID) | ORAL | Status: DC | PRN
Start: 2012-11-18 — End: 2012-11-19
  Administered 2012-11-18 – 2012-11-19 (×3): 1000 mg via ORAL
  Filled 2012-11-18 (×3): qty 2

## 2012-11-18 NOTE — Evaluation (Signed)
Physical Therapy Evaluation Patient Details Name: Summer Davis MRN: [id number] DOB: Nov 29, 1930 Today's Date: 11/18/2012 Time: 0900-0930 PT Time Calculation (min): 30 min  PT Assessment / Plan / Recommendation History of Present Illness     Clinical Impression  Pt s/p R TKR presents with decreased R LE strength/ROM and post op pain limiting functional mobility    PT Assessment  Patient needs continued PT services    Follow Up Recommendations  Home health PT    Does the patient have the potential to tolerate intense rehabilitation      Barriers to Discharge        Equipment Recommendations  None recommended by PT    Recommendations for Other Services OT consult   Frequency 7X/week    Precautions / Restrictions Precautions Precautions: Fall Restrictions Weight Bearing Restrictions: No Other Position/Activity Restrictions: WBAT   Pertinent Vitals/Pain 7/10; premed, cold packs provided      Mobility  Bed Mobility Bed Mobility: Supine to Sit Supine to Sit: 4: Min assist Details for Bed Mobility Assistance: cues for sequence and use of L LE to self assist Transfers Transfers: Sit to Stand;Stand to Sit Sit to Stand: 4: Min assist Stand to Sit: 4: Min assist Details for Transfer Assistance: cues for LE management and use of UEs to self assist Ambulation/Gait Ambulation/Gait Assistance: 4: Min assist Ambulation Distance (Feet): 29 Feet Assistive device: Rolling walker Ambulation/Gait Assistance Details: cues for posture, sequence and position from RW Gait Pattern: Step-to pattern;Decreased stance time - right;Antalgic Stairs: No    Exercises Total Joint Exercises Ankle Circles/Pumps: AROM;Both;10 reps;Supine Quad Sets: AROM;Both;10 reps;Supine Heel Slides: AAROM;Right;10 reps;Supine Straight Leg Raises: AROM;AAROM;Right;10 reps;Supine   PT Diagnosis: Difficulty walking  PT Problem List: Decreased strength;Decreased range of motion;Decreased activity  tolerance;Decreased balance;Decreased mobility;Decreased knowledge of use of DME;Pain PT Treatment Interventions: DME instruction;Gait training;Stair training;Functional mobility training;Therapeutic activities;Therapeutic exercise;Patient/family education     PT Goals(Current goals can be found in the care plan section) Acute Rehab PT Goals Patient Stated Goal: Resume previous active lifestyle with decreased pain PT Goal Formulation: With patient Time For Goal Achievement: 11/25/12 Potential to Achieve Goals: Good  Visit Information  Last PT Received On: 11/18/12 Assistance Needed: +1       Prior Functioning  Home Living Family/patient expects to be discharged to:: Private residence Living Arrangements: Spouse/significant other Available Help at Discharge: Family Type of Home: House Home Access: Stairs to enter Secretary/administrator of Steps: 3+1 Entrance Stairs-Rails: Right Home Layout: One level Home Equipment: Environmental consultant - 2 wheels;Cane - single point Prior Function Level of Independence: Independent Communication Communication: No difficulties Dominant Hand: Right    Cognition  Cognition Arousal/Alertness: Awake/alert Behavior During Therapy: WFL for tasks assessed/performed Overall Cognitive Status: Within Functional Limits for tasks assessed    Extremity/Trunk Assessment Upper Extremity Assessment Upper Extremity Assessment: Overall WFL for tasks assessed Lower Extremity Assessment Lower Extremity Assessment: RLE deficits/detail RLE Deficits / Details: 3-/5 quads with AAROM at knee -10 - 75 Cervical / Trunk Assessment Cervical / Trunk Assessment: Normal   Balance    End of Session PT - End of Session Equipment Utilized During Treatment: Gait belt Activity Tolerance: Patient tolerated treatment well Patient left: in chair;with call bell/phone within reach Nurse Communication: Mobility status  GP     Yahmir Sokolov 11/18/2012, 10:28 AM

## 2012-11-18 NOTE — Progress Notes (Signed)
   Subjective: 1 Day Post-Op Procedure(s) (LRB): RIGHT TOTAL KNEE ARTHROPLASTY (Right)   Patient reports pain as mild, pain well controlled. C/o some pain in the top of the right foot which has resolved since she has got out of bed. No other events throughout the night.   Objective:   VITALS:   Filed Vitals:   11/18/12 0604  BP: 112/66  Pulse: 64  Temp: 98 F (36.7 C)  Resp: 16    Neurovascular intact Dorsiflexion/Plantar flexion intact Incision: dressing C/D/I No cellulitis present Compartment soft  LABS  Recent Labs  11/18/12 0434  HGB 12.3  HCT 37.5  WBC 12.4*  PLT 183     Recent Labs  11/18/12 0434  NA 134*  K 4.3  BUN 8  CREATININE 0.48*  GLUCOSE 142*     Assessment/Plan: 1 Day Post-Op Procedure(s) (LRB): RIGHT TOTAL KNEE ARTHROPLASTY (Right) HV drain d/c'ed Foley cath d/c'ed Advance diet Up with therapy D/C IV fluids Discharge home with home health eventually, possibly tomorrow if ready       Anastasio Auerbach. Tashina Credit   PAC  11/18/2012, 10:02 AM

## 2012-11-18 NOTE — Evaluation (Signed)
Occupational Therapy Evaluation Patient Details Name: Summer Davis MRN: [id number] DOB: 1930-12-22 Today's Date: 11/18/2012 Time: 4098-1191 OT Time Calculation (min): 12 min  OT Assessment / Plan / Recommendation History of present illness Pt is s/p R TKA.   Clinical Impression   Pt is s/p R TKA and is limited this session due to nausea. Only able to tolerate transferring from chair back to bed due to nausea. Will need to practice toileting and other ADL for home.    OT Assessment  Patient needs continued OT Services    Follow Up Recommendations  No OT follow up;Supervision/Assistance - 24 hour    Barriers to Discharge      Equipment Recommendations  None recommended by OT    Recommendations for Other Services    Frequency  Min 2X/week    Precautions / Restrictions Precautions Precautions: Fall;Knee Restrictions Weight Bearing Restrictions: No Other Position/Activity Restrictions: WBAT   Pertinent Vitals/Pain 7/10; nursing aware; ice    ADL  Eating/Feeding: Simulated;Independent Where Assessed - Eating/Feeding: Chair Grooming: Simulated;Wash/dry face;Set up Where Assessed - Grooming: Supine, head of bed up Upper Body Bathing: Simulated;Chest;Right arm;Left arm;Abdomen;Set up;Supervision/safety Where Assessed - Upper Body Bathing: Unsupported sitting Lower Body Bathing: Simulated;Maximal assistance (limited by nausea this session; min assist to stand) Where Assessed - Lower Body Bathing: Supported sit to stand Upper Body Dressing: Simulated;Set up;Supervision/safety Where Assessed - Upper Body Dressing: Unsupported sitting Lower Body Dressing: Simulated;Maximal assistance (due to significant nausea) Where Assessed - Lower Body Dressing: Supported sit to stand Toilet Transfer: Mining engineer Method: Stand pivot Toileting - Clothing Manipulation and Hygiene: Simulated;Moderate assistance Where Assessed - Toileting Clothing Manipulation  and Hygiene: Standing Equipment Used: Rolling walker ADL Comments: Pt requesting to return to bed as she is feeling very nauseous. Nursing made aware. Session limited due to nausea.     OT Diagnosis: Generalized weakness  OT Problem List: Decreased strength;Decreased activity tolerance;Pain;Decreased knowledge of use of DME or AE OT Treatment Interventions: Self-care/ADL training;DME and/or AE instruction;Therapeutic activities;Patient/family education   OT Goals(Current goals can be found in the care plan section) Acute Rehab OT Goals Patient Stated Goal: Resume previous active lifestyle with decreased pain OT Goal Formulation: With patient Time For Goal Achievement: 11/25/12 Potential to Achieve Goals: Good ADL Goals Pt Will Perform Grooming: with supervision;standing Pt Will Perform Lower Body Bathing: with supervision;sit to/from stand Pt Will Perform Lower Body Dressing: with supervision;sit to/from stand Pt Will Transfer to Toilet: with supervision;ambulating;bedside commode Pt Will Perform Toileting - Clothing Manipulation and hygiene: with supervision;sit to/from stand Pt Will Perform Tub/Shower Transfer: with min guard assist;Shower transfer;shower seat  Visit Information  Last OT Received On: 11/18/12 Assistance Needed: +1 History of Present Illness: Pt is s/p R TKA.       Prior Functioning     Home Living Family/patient expects to be discharged to:: Private residence Living Arrangements: Spouse/significant other Available Help at Discharge: Family Type of Home: House Home Access: Stairs to enter Secretary/administrator of Steps: 3+1 Entrance Stairs-Rails: Right Home Layout: One level Home Equipment: Environmental consultant - 2 wheels;Cane - single point;Bedside commode;Shower seat Prior Function Level of Independence: Independent Communication Communication: No difficulties Dominant Hand: Right         Vision/Perception     Cognition  Cognition Arousal/Alertness:  Awake/alert Behavior During Therapy: WFL for tasks assessed/performed Overall Cognitive Status: Within Functional Limits for tasks assessed    Extremity/Trunk Assessment Upper Extremity Assessment Upper Extremity Assessment: Overall WFL for tasks assessed Lower Extremity Assessment Lower  Extremity Assessment: RLE deficits/detail RLE Deficits / Details: 3-/5 quads with AAROM at knee -10 - 75 Cervical / Trunk Assessment Cervical / Trunk Assessment: Normal     Mobility Bed Mobility Bed Mobility: Sit to Supine Supine to Sit: 4: Min assist Details for Bed Mobility Assistance: assist for R LE onto bed. Transfers Transfers: Sit to Stand;Stand to Sit Sit to Stand: 4: Min assist;With upper extremity assist;From chair/3-in-1 Stand to Sit: 4: Min assist;To bed Details for Transfer Assistance: verbal cues for hand placement        Balance     End of Session OT - End of Session Activity Tolerance: Other (comment) (nausea) Patient left: in bed;with call bell/phone within reach  GO     Lennox Laity [phone] 11/18/2012, 11:04 AM

## 2012-11-18 NOTE — Progress Notes (Signed)
Utilization review completed.  

## 2012-11-18 NOTE — Progress Notes (Addendum)
Pt has had several bouts of nausea throughout the day. Declines additional nausea medication. Will leave IVF running at [facility].

## 2012-11-18 NOTE — Progress Notes (Signed)
Physical Therapy Treatment Patient Details Name: Neyda Durango MRN: [id number] DOB: Sep 12, 1930 Today's Date: 11/18/2012 Time: 6578-4696 PT Time Calculation (min): 19 min  PT Assessment / Plan / Recommendation  PT Comments     Follow Up Recommendations  Home health PT     Does the patient have the potential to tolerate intense rehabilitation     Barriers to Discharge        Equipment Recommendations  None recommended by PT    Recommendations for Other Services OT consult  Frequency 7X/week   Progress towards PT Goals Progress towards PT goals: Progressing toward goals  Plan Current plan remains appropriate    Precautions / Restrictions Precautions Precautions: Fall;Knee Restrictions Weight Bearing Restrictions: No Other Position/Activity Restrictions: WBAT   Pertinent Vitals/Pain 8/10; MEds requested, ice packs provided    Mobility  Bed Mobility Bed Mobility: Supine to Sit;Sit to Supine Supine to Sit: 4: Min guard Sit to Supine: 4: Min guard Details for Bed Mobility Assistance: assist for R LE onto bed. Transfers Transfers: Sit to Stand;Stand to Sit Sit to Stand: 4: Min guard Stand to Sit: 4: Min guard Details for Transfer Assistance: verbal cues for hand placement Ambulation/Gait Ambulation/Gait Assistance: 4: Min assist;4: Min guard Ambulation Distance (Feet): 111 Feet Assistive device: Rolling walker Ambulation/Gait Assistance Details: cues for posture, sequence and position from RW Gait Pattern: Step-to pattern;Decreased stance time - right;Antalgic Stairs: No    Exercises     PT Diagnosis:    PT Problem List:   PT Treatment Interventions:     PT Goals (current goals can now be found in the care plan section) Acute Rehab PT Goals Patient Stated Goal: Resume previous active lifestyle with decreased pain PT Goal Formulation: With patient Time For Goal Achievement: 11/25/12 Potential to Achieve Goals: Good  Visit Information  Last PT Received On:  11/18/12 Assistance Needed: +1    Subjective Data  Patient Stated Goal: Resume previous active lifestyle with decreased pain   Cognition  Cognition Arousal/Alertness: Awake/alert Behavior During Therapy: WFL for tasks assessed/performed Overall Cognitive Status: Within Functional Limits for tasks assessed    Balance     End of Session PT - End of Session Equipment Utilized During Treatment: Gait belt Activity Tolerance: Patient tolerated treatment well;Patient limited by pain Patient left: in bed;with call bell/phone within reach;with family/visitor present Nurse Communication: Mobility status;Patient requests pain meds   GP     Abby Tucholski 11/18/2012, 3:10 PM

## 2012-11-19 DIAGNOSIS — D500 Iron deficiency anemia secondary to blood loss (chronic): Secondary | ICD-10-CM | POA: Diagnosis not present

## 2012-11-19 LAB — BASIC METABOLIC PANEL
BUN: 8 mg/dL (ref 6–23)
CO2: 26 mEq/L (ref 19–32)
Calcium: 8.7 mg/dL (ref 8.4–10.5)
Chloride: 101 mEq/L (ref 96–112)
Creatinine, Ser: 0.56 mg/dL (ref 0.50–1.10)
GFR calc Af Amer: >90 mL/min (ref >90)
GFR calc non Af Amer: 86 mL/min — ABNORMAL LOW (ref >90)
Glucose, Bld: 125 mg/dL — ABNORMAL HIGH (ref 70–99)
Potassium: 4.4 mEq/L (ref 3.5–5.1)
Sodium: 134 mEq/L — ABNORMAL LOW (ref 135–145)

## 2012-11-19 LAB — CBC
HCT: 31.3 % — ABNORMAL LOW (ref 36.0–46.0)
Hemoglobin: 10.6 g/dL — ABNORMAL LOW (ref 12.0–15.0)
MCH: 28.8 pg (ref 26.0–34.0)
MCHC: 33.9 g/dL (ref 30.0–36.0)
MCV: 85.1 fL (ref 78.0–100.0)
Platelets: 187 10*3/uL (ref 150–400)
RBC: 3.68 MIL/uL — ABNORMAL LOW (ref 3.87–5.11)
RDW: 13.2 % (ref 11.5–15.5)
WBC: 8.1 10*3/uL (ref 4.0–10.5)

## 2012-11-19 MED ORDER — TIZANIDINE HCL 4 MG PO CAPS: 4.0000 mg | ORAL_CAPSULE | Freq: Three times a day (TID) | ORAL | Status: DC | PRN

## 2012-11-19 MED ORDER — CELECOXIB 200 MG PO CAPS: 200.0000 mg | ORAL_CAPSULE | Freq: Two times a day (BID) | ORAL | Status: DC

## 2012-11-19 MED ORDER — FERROUS SULFATE 325 (65 FE) MG PO TABS: 325.0000 mg | ORAL_TABLET | Freq: Three times a day (TID) | ORAL | Status: DC

## 2012-11-19 MED ORDER — KETOROLAC TROMETHAMINE 15 MG/ML IJ SOLN
15.0000 mg | Freq: Four times a day (QID) | INTRAMUSCULAR | Status: DC
  Administered 2012-11-19: 15 mg via INTRAVENOUS
  Filled 2012-11-19: qty 1

## 2012-11-19 MED ORDER — POLYETHYLENE GLYCOL 3350 17 G PO PACK: 17.0000 g | PACK | Freq: Two times a day (BID) | ORAL | Status: DC

## 2012-11-19 MED ORDER — ASPIRIN 325 MG PO TBEC: 325.0000 mg | DELAYED_RELEASE_TABLET | Freq: Two times a day (BID) | ORAL | Status: AC

## 2012-11-19 MED ORDER — ACETAMINOPHEN 500 MG PO TABS: 1000.0000 mg | ORAL_TABLET | Freq: Four times a day (QID) | ORAL | Status: DC | PRN

## 2012-11-19 MED ORDER — DSS 100 MG PO CAPS: 100.0000 mg | ORAL_CAPSULE | Freq: Two times a day (BID) | ORAL | Status: DC

## 2012-11-19 NOTE — Discharge Summary (Signed)
Physician Discharge Summary  Patient ID: Summer Davis MRN: [id number] DOB/AGE: [birth year] 79 y.o.  Admit date: 11/17/2012 Discharge date: 11/19/2012   Procedures:  Procedure(s) (LRB): RIGHT TOTAL KNEE ARTHROPLASTY (Right)  Attending Physician:  Dr. Durene Romans   Admission Diagnoses:   Right knee OA / pain  Discharge Diagnoses:  Principal Problem:   S/P right TKA Active Problems:   Expected blood loss anemia  Past Medical History  Diagnosis Date  . H/O scoliosis   . GERD (gastroesophageal reflux disease)   . Arthritis   . Hypercholesterolemia   . Hypothyroidism   . UTI (lower urinary tract infection)   . Tortuous colon   . Peripheral vascular disease     h/x varicose veins  . Insomnia   . PONV (postoperative nausea and vomiting)     HPI: Summer Davis, 79 y.o. female, has a history of pain and functional disability in the right knee due to arthritis and has failed non-surgical conservative treatments for greater than 12 weeks to includeNSAID's and/or analgesics, corticosteriod injections, viscosupplementation injections and activity modification. Onset of symptoms was gradual, starting 3+ years ago with gradually worsening course since that time. The patient noted prior procedures on the knee to include arthroscopy on the right knee(s). Patient currently rates pain in the right knee(s) at 8 out of 10 with activity. Patient has worsening of pain with activity and weight bearing, pain that interferes with activities of daily living, pain with passive range of motion, crepitus and joint swelling. Patient has evidence of periarticular osteophytes and joint space narrowing by imaging studies. There is no active signs of infection. Risks, benefits and expectations were discussed with the patient. Patient understand the risks, benefits and expectations and wishes to proceed with surgery.   PCP: Sissy Hoff, MD   Discharged Condition: good  Hospital Course:  Patient  underwent the above stated procedure on 11/17/2012. Patient tolerated the procedure well and brought to the recovery room in good condition and subsequently to the floor.  POD #1 BP: 112/66 ; Pulse: 64 ; Temp: 98 F (36.7 C) ; Resp: 16  Pt's foley was removed, as well as the hemovac drain removed. IV was changed to a saline lock. Patient reports pain as mild, pain well controlled. C/o some pain in the top of the right foot which has resolved since she has got out of bed. No other events throughout the night. Neurovascular intact, dorsiflexion/plantar flexion intact, incision: dressing C/D/I, no cellulitis present and compartment soft.   LABS  Basename    HGB  12.3  HCT  37.5   POD #2  BP: 133/70 ; Pulse: 79 ; Temp: 98.7 F (37.1 C) ; Resp: 16 Patient reports pain as mild, well controlled. No events throughout the night. C/o of pain in the top of the right foot. The pain relieves some when she gets up and walks and worse when laying down. No other events throughout the night. Ready to be discharge home. Neurovascular intact, dorsiflexion/plantar flexion intact, incision: dressing C/D/I, no cellulitis present and compartment soft.   LABS  Basename    HGB  10.6  HCT  31.3    Discharge Exam: General appearance: alert, cooperative and no distress Extremities: Homans sign is negative, no sign of DVT, no edema, redness or tenderness in the calves or thighs and no ulcers, gangrene or trophic changes  Disposition:    Home or Self Care with follow up in 2 weeks   Follow-up Information   Follow up with  [name], MD. Schedule an appointment as soon as possible for a visit in 2 weeks.   Contact information:   [address] [place] [address] [phone]       Discharge Orders   Future Orders Complete By Expires     Call MD / Call 911  As directed     Comments:      If you experience chest pain or shortness of breath, CALL 911 and be transported to the hospital  emergency room.  If you develope a fever above 101 F, pus (white drainage) or increased drainage or redness at the wound, or calf pain, call your surgeon's office.    Change dressing  As directed     Comments:      Maintain surgical dressing for 10-14 days, then change the dressing daily with sterile 4 x 4 inch gauze dressing and tape. Keep the area dry and clean.    Constipation Prevention  As directed     Comments:      Drink plenty of fluids.  Prune juice may be helpful.  You may use a stool softener, such as Colace (over the counter) 100 mg twice a day.  Use MiraLax (over the counter) for constipation as needed.    Diet - low sodium heart healthy  As directed     Discharge instructions  As directed     Comments:      Maintain surgical dressing for 10-14 days, then replace with gauze and tape. Keep the area dry and clean until follow up. Follow up in 2 weeks at [facility]. Call with any questions or concerns.    Driving restrictions  As directed     Comments:      No driving for 4 weeks    Increase activity slowly as tolerated  As directed     TED hose  As directed     Comments:      Use stockings (TED hose) for 2 weeks on both leg(s).  You may remove them at night for sleeping.    Weight bearing as tolerated  As directed          Medication List    STOP taking these medications       aspirin 81 MG tablet  Replaced by:  aspirin 325 MG EC tablet      TAKE these medications       acetaminophen 500 MG tablet  Commonly known as:  TYLENOL  Take 2 tablets (1,000 mg total) by mouth every 6 (six) hours as needed.     aspirin 325 MG EC tablet  Take 1 tablet (325 mg total) by mouth 2 (two) times daily.     calcium carbonate 500 MG chewable tablet  Commonly known as:  TUMS - dosed in mg elemental calcium  Chew 1 tablet by mouth daily.     CALCIUM-MAGNESIUM PO  Take 1 tablet by mouth daily.     celecoxib 200 MG capsule  Commonly known as:  CELEBREX  Take 1 capsule  (200 mg total) by mouth 2 (two) times daily.     cholecalciferol 1000 UNITS tablet  Commonly known as:  VITAMIN D  Take 1,000 Units by mouth daily.     DSS 100 MG Caps  Take 100 mg by mouth 2 (two) times daily.     ferrous sulfate 325 (65 FE) MG tablet  Take 1 tablet (325 mg total) by mouth 3 (three) times daily after meals.  gabapentin 300 MG capsule  Commonly known as:  NEURONTIN  Take 300 mg by mouth daily.     glucosamine-chondroitin 500-400 MG tablet  Take 2 tablets by mouth daily.     ICAPS Caps  Take 1 capsule by mouth 2 (two) times daily.     levothyroxine 75 MCG tablet  Commonly known as:  SYNTHROID, LEVOTHROID  Take 75 mcg by mouth every other day. SUN, Tuesday, Thursday, Sat     levothyroxine 88 MCG tablet  Commonly known as:  SYNTHROID, LEVOTHROID  Take 88 mcg by mouth every other day. MON, WED, FRIDAY     polyethylene glycol packet  Commonly known as:  MIRALAX / GLYCOLAX  Take 17 g by mouth 2 (two) times daily.     SYSTANE 0.4-0.3 % Soln  Generic drug:  Polyethyl Glycol-Propyl Glycol  Place 1 drop into both eyes daily as needed (for dry eyes).     tiZANidine 4 MG capsule  Commonly known as:  ZANAFLEX  Take 1 capsule (4 mg total) by mouth 3 (three) times daily as needed for muscle spasms.         Signed: Anastasio Auerbach. Natan Hartog   PAC  11/19/2012, 11:50 AM

## 2012-11-19 NOTE — Progress Notes (Signed)
Occupational Therapy Treatment Patient Details Name: Summer Davis MRN: [id number] DOB: 09-Jun-1930 Today's Date: 11/19/2012 Time: 4098-1191 OT Time Calculation (min): 18 min  OT Assessment / Plan / Recommendation  OT comments  Pt progressing well. Pt not feeling nauseous today. Husband present for education.  Follow Up Recommendations  No OT follow up;Supervision/Assistance - 24 hour    Barriers to Discharge       Equipment Recommendations  None recommended by OT    Recommendations for Other Services    Frequency Min 2X/week   Progress towards OT Goals Progress towards OT goals: Progressing toward goals  Plan Discharge plan remains appropriate    Precautions / Restrictions Precautions Precautions: Fall;Knee Restrictions Weight Bearing Restrictions: No Other Position/Activity Restrictions: WBAT   Pertinent Vitals/Pain 710 at start of session; 6 at end of session; reposition. Declined ice due to getting ready to take bath.    ADL  Grooming: Wash/dry hands;Min guard Where Assessed - Grooming: Unsupported standing Lower Body Dressing: Performed;+1 Total assistance (for R sock. unable to reach foot currently) Where Assessed - Lower Body Dressing: Unsupported sitting Toilet Transfer: Performed;Min guard Toilet Transfer Method: Other (comment) (with walker into bathroom) Toilet Transfer Equipment: Raised toilet seat with arms (or 3-in-1 over toilet) Toileting - Clothing Manipulation and Hygiene: Performed;Min guard Where Assessed - Toileting Clothing Manipulation and Hygiene: Sit to stand from 3-in-1 or toilet Tub/Shower Transfer: Performed;Min guard (step back over ledge and forwards to come out) Equipment Used: Rolling walker ADL Comments: Pt doing much better today. Spouse present for education and practiced hands on with shower transfer to steady RW. Discussed placement of shower seat depending on if walker will go through shower opening.     OT Diagnosis:    OT Problem  List:   OT Treatment Interventions:     OT Goals(current goals can now be found in the care plan section)    Visit Information  Last OT Received On: 11/19/12 Assistance Needed: +1 History of Present Illness: Pt is s/p R TKA.    Subjective Data      Prior Functioning       Cognition  Cognition Arousal/Alertness: Awake/alert Behavior During Therapy: WFL for tasks assessed/performed Overall Cognitive Status: Within Functional Limits for tasks assessed    Mobility  Bed Mobility Bed Mobility: Supine to Sit Supine to Sit: 4: Min guard;HOB elevated Transfers Transfers: Sit to Stand;Stand to Sit Sit to Stand: 4: Min guard;With upper extremity assist;From chair/3-in-1 Stand to Sit: 4: Min guard;With upper extremity assist;To chair/3-in-1    Exercises      Balance Balance Balance Assessed: Yes Dynamic Standing Balance Dynamic Standing - Level of Assistance: 4: Min assist (min guard)   End of Session OT - End of Session Activity Tolerance: Patient tolerated treatment well Patient left: in chair;with call bell/phone within reach;with family/visitor present  GO     Lennox Laity [phone] 11/19/2012, 9:35 AM

## 2012-11-19 NOTE — Progress Notes (Signed)
Physical Therapy Treatment Patient Details Name: Summer Davis MRN: [id number] DOB: 1931-04-27 Today's Date: 11/19/2012 Time: 6962-9528 PT Time Calculation (min): 33 min  PT Assessment / Plan / Recommendation  PT Comments   POD # 2 R TKR planning to D/C to home today.  Amb in hallway then performed steps using one right rail and one crutch with spouse present for family education.  Also given handout on HEP and instructed on use of ICE.   Follow Up Recommendations  Home health PT     Does the patient have the potential to tolerate intense rehabilitation     Barriers to Discharge        Equipment Recommendations  None recommended by PT (pt issued one crutch for stairs)    Recommendations for Other Services    Frequency 7X/week   Progress towards PT Goals Progress towards PT goals: Progressing toward goals  Plan Current plan remains appropriate    Precautions / Restrictions Precautions Precautions: None Restrictions Weight Bearing Restrictions: No Other Position/Activity Restrictions: WBAT    Pertinent Vitals/Pain C/o 3/10 during avt ICE applied    Mobility  Bed Mobility Bed Mobility: Not assessed Supine to Sit: 4: Min guard;HOB elevated Details for Bed Mobility Assistance: Pt OOB in recliner Transfers Transfers: Sit to Stand;Stand to Sit Sit to Stand: 5: Supervision;From chair/3-in-1 Stand to Sit: To chair/3-in-1 Details for Transfer Assistance: one VC on safety with stand to sit to extend R LE  Ambulation/Gait Ambulation/Gait Assistance: 5: Supervision Ambulation Distance (Feet): 135 Feet Assistive device: Rolling walker Ambulation/Gait Assistance Details: increased time and < 25% VC's on safety with turns and backward gait Gait Pattern: Step-to pattern;Decreased stance time - right;Antalgic Stairs: Yes Stairs Assistance: 5: Supervision;4: Min guard Stairs Assistance Details (indicate cue type and reason): with spouse present for family education and 25% VC's  on proper tech and safety using one rail and one crutch Stair Management Technique: One rail Right;With crutches Number of Stairs: 4     PT Goals (current goals can now be found in the care plan section)      progressing    Visit Information  Last PT Received On: 11/19/12 Assistance Needed: +1 History of Present Illness: Pt is s/p R TKA.    Subjective Data      Cognition  Cognition Arousal/Alertness: Awake/alert Behavior During Therapy: WFL for tasks assessed/performed Overall Cognitive Status: Within Functional Limits for tasks assessed    Balance  Balance Balance Assessed: Yes Dynamic Standing Balance Dynamic Standing - Level of Assistance: 4: Min assist (min guard)  End of Session PT - End of Session Equipment Utilized During Treatment: Gait belt Patient left: in chair;with call bell/phone within reach;with family/visitor present   Felecia Shelling  PTA [facility]  Acute  Rehab Pager      [phone]

## 2012-11-19 NOTE — Care Management Note (Signed)
    Page 1 of 1   11/19/2012     5:52:34 PM   CARE MANAGEMENT NOTE 11/19/2012  Patient:  Summer Davis, Summer Davis   Account Number:  [id number]  Date Initiated:  11/18/2012  Documentation initiated by:  Colleen Can  Subjective/Objective Assessment:   dx total rt knee replacemnt     Action/Plan:   CM spoke with patient and spouse. Plans are for patient to return to her home where spouse will be caregiver. She already has shower chair, rw, amd commode. Genevieve Norlander will provide [facility] services.   Anticipated DC Date:  11/19/2012   Anticipated DC Plan:  HOME W HOME HEALTH SERVICES  In-house referral  Clinical Social Worker      DC Planning Services  CM consult      [facility] Choice  HOME HEALTH   Choice offered to / List presented to:  C-1 Patient        HH arranged  HH-2 PT      [facility] agency  [facility]   Status of service:  Completed, signed off Medicare Important Message given?  NA - LOS <3 / Initial given by admissions (If response is "NO", the following Medicare IM given date fields will be blank) Date Medicare IM given:   Date Additional Medicare IM given:    Discharge Disposition:  HOME W HOME HEALTH SERVICES  Per UR Regulation:    If discussed at Long Length of Stay Meetings, dates discussed:    Comments:  11/19/2012 Colleen Can BSN RN CCM [phone] Pt discharged today with [facility] services to start tomorrow 11/20/2012.

## 2012-11-19 NOTE — Progress Notes (Signed)
   Subjective: 2 Days Post-Op Procedure(s) (LRB): RIGHT TOTAL KNEE ARTHROPLASTY (Right)   Patient reports pain as mild, well controlled. No events throughout the night. C/o of pain in the top of the right foot. The pain relieves some when she gets up and walks and worse when laying down. No other events throughout the night. Ready to be discharge home.  Objective:   VITALS:   Filed Vitals:   11/19/12 0436  BP: 133/70  Pulse: 79  Temp: 98.7 F (37.1 C)  Resp: 16    Neurovascular intact Dorsiflexion/Plantar flexion intact Incision: dressing C/D/I No cellulitis present Compartment soft  LABS  Recent Labs  11/18/12 0434 11/19/12 0400  HGB 12.3 10.6*  HCT 37.5 31.3*  WBC 12.4* 8.1  PLT 183 187     Recent Labs  11/18/12 0434 11/19/12 0400  NA 134* 134*  K 4.3 4.4  BUN 8 8  CREATININE 0.48* 0.56  GLUCOSE 142* 125*     Assessment/Plan: 2 Days Post-Op Procedure(s) (LRB): RIGHT TOTAL KNEE ARTHROPLASTY (Right) Give a dose or 2 of Toradol to help calm down knee and foot inflammation. Up with therapy Discharge home with home health Follow up in 2 weeks at [facility]. Follow up with OLIN,Rosabell Geyer D in 2 weeks.  Contact information:  [facility] [address], [address] [place] [address] [phone]      Expected ABLA  Treated with iron and will observe      Anastasio Auerbach. Jaymarion Trombly   PAC  11/19/2012, 8:24 AM

## 2013-01-13 ENCOUNTER — Encounter: Payer: Self-pay | Admitting: Neurology

## 2013-01-13 ENCOUNTER — Ambulatory Visit (INDEPENDENT_AMBULATORY_CARE_PROVIDER_SITE_OTHER): Payer: Medicare Other | Admitting: Neurology

## 2013-01-13 VITALS — BP 155/82 | HR 89 | Ht 62.0 in | Wt 117.0 lb

## 2013-01-13 DIAGNOSIS — R202 Paresthesia of skin: Secondary | ICD-10-CM | POA: Insufficient documentation

## 2013-01-13 DIAGNOSIS — R209 Unspecified disturbances of skin sensation: Secondary | ICD-10-CM

## 2013-01-13 MED ORDER — GABAPENTIN 100 MG PO CAPS: 300.0000 mg | ORAL_CAPSULE | Freq: Three times a day (TID) | ORAL | Status: DC

## 2013-01-13 NOTE — Progress Notes (Signed)
[facility]  PATIENT: Summer Davis DOB: 02/12/1931  HISTORICAL  Summer Davis is a 79 years old right-handed Caucasian female, referred by her primary care physician Dr. Jena Gauss for evaluation of bilateral lower extremity burning pain.  She had past medical history of right knee replacement in July 2014, history of left hip replacement in 2000, previous history of headache, she has responded to gabapentin 300 mg every day, hypothyroidism, on supplement  She had laser surgery for bilateral lower extremity vein in July 2013, without any other clear trigger, around December 2013, she began to feel bilateral lower extremity burning sensation, from the calf down, starting from her knee, eventually involving bilateral feet top and bottom, constant pressure burning sensation, difficult to sleep, her symptoms improved with gabapentin 300 mg every day,  which she took at 3 PM, she denies significant gait difficulty, during that period of time, she also suffered severe right knee pain, midline low back pain,   Overall her symptoms have much improved, especially after her right knee replacement, she only has intermittent bilateral feet burning pain, no distal weakness or  distal sensory loss, she denies gait difficulty, she continues to complains of midline low back pain, no radiating pain to bilateral lower extremity no bowel or bladder incontinence, she denied bilateral upper extremity paresthesia  Laboratory evaluation showed normal CBC, CMP, TSH.  REVIEW OF SYSTEMS: Full 14 system review of systems performed and notable only for  joint pain, achy muscles, memory loss, headache, weakness, insomnia, restless legs, skin sensitivity  ALLERGIES: Allergies  Allergen Reactions  . Codeine   . Lipitor [Atorvastatin]   . Oxycodone Nausea And Vomiting  . Zetia [Ezetimibe]   . Zocor [Simvastatin]     HOME MEDICATIONS: Outpatient Prescriptions Prior to Visit  Medication Sig  Dispense Refill  . acetaminophen (TYLENOL) 500 MG tablet Take 2 tablets (1,000 mg total) by mouth every 6 (six) hours as needed.  30 tablet  0  . calcium carbonate (TUMS - DOSED IN MG ELEMENTAL CALCIUM) 500 MG chewable tablet Chew 2 tablets by mouth daily.       Marland Kitchen CALCIUM-MAGNESIUM PO Take 300 tablets by mouth daily.       . cholecalciferol (VITAMIN D) 1000 UNITS tablet Take 1,000 Units by mouth daily.      Marland Kitchen docusate sodium 100 MG CAPS Take 100 mg by mouth 2 (two) times daily.  10 capsule  0  . glucosamine-chondroitin 500-400 MG tablet Take 2 tablets by mouth daily.      Marland Kitchen levothyroxine (SYNTHROID, LEVOTHROID) 75 MCG tablet Take 75 mcg by mouth every other day. SUN, Tuesday, Thursday, Sat      . levothyroxine (SYNTHROID, LEVOTHROID) 88 MCG tablet Take 88 mcg by mouth every other day. MON, WED, FRIDAY      . Multiple Vitamins-Minerals (ICAPS) CAPS Take 1 capsule by mouth 2 (two) times daily.      [name] 0.4-0.3 % SOLN Place 1 drop into both eyes daily as needed (for dry eyes).      Marland Kitchen tiZANidine (ZANAFLEX) 4 MG capsule Take 1 capsule (4 mg total) by mouth 3 (three) times daily as needed for muscle spasms.  50 capsule  0  . gabapentin (NEURONTIN) 300 MG capsule Take 300 mg by mouth daily.       . celecoxib (CELEBREX) 200 MG capsule Take 1 capsule (200 mg total) by mouth 2 (two) times daily.      . ferrous sulfate 325 (65  FE) MG tablet Take 1 tablet (325 mg total) by mouth 3 (three) times daily after meals.    3  . polyethylene glycol (MIRALAX / GLYCOLAX) packet Take 17 g by mouth 2 (two) times daily.  14 each  0   No facility-administered medications prior to visit.    PAST MEDICAL HISTORY: Past Medical History  Diagnosis Date  . H/O scoliosis   . GERD (gastroesophageal reflux disease)   . Arthritis   . Hypercholesterolemia   . Hypothyroidism   . UTI (lower urinary tract infection)   . Tortuous colon   . Peripheral vascular disease     h/x varicose  veins  . Insomnia   . PONV (postoperative nausea and vomiting)   . Esophageal reflux     PAST SURGICAL HISTORY: Past Surgical History  Procedure Laterality Date  . Total hip arthroplasty      Left  . Cholecystectomy    . Tonsillectomy and adenoidectomy    . Endoscopic vein laser treatment    . Eye muscle surgery Left   . Foot neuroma surgery Right   . Total knee arthroplasty Right 11/17/2012    Procedure: RIGHT TOTAL KNEE ARTHROPLASTY;  Surgeon: [name], MD;  Location: [facility];  Service: Orthopedics;  Laterality: Right;    FAMILY HISTORY: Family History  Problem Relation Age of Onset  . Heart disease Father     Later onset CAD and heart failure  . Cancer Mother     Liver and pancreas    SOCIAL HISTORY:  History   Social History  . Marital Status: Married    Spouse Name: Winfred    Number of Children: 4  . Years of Education: 12   Occupational History  .      Retired   Social History Main Topics  . Smoking status: Never Smoker   . Smokeless tobacco: Never Used  . Alcohol Use: No  . Drug Use: No  . Sexual Activity: Not on file   Other Topics Concern  . Not on file   Social History Narrative   Patient lives at home with her husband (Winfred). Patient is retired. High school education.   Right handed.   Caffeine- one cup coffee daily.   One child deceased.     PHYSICAL EXAM    Filed Vitals:   01/13/13 1302  BP: 155/82  Pulse: 89  Height: 5\' 2"  (1.575 m)  Weight: 117 lb (53.071 kg)     Body mass index is 21.39 kg/(m^2).   Generalized: In no acute distress  Neck: Supple, no carotid bruits   Cardiac: Regular rate rhythm  Pulmonary: Clear to auscultation bilaterally  Musculoskeletal: No deformity  Neurological examination  Mentation: Alert oriented to time, place, history taking, and causual conversation  Cranial nerve II-XII: Pupils were equal round reactive to light extraocular movements were full, visual field were full on  confrontational test. facial sensation and strength were normal. hearing was intact to finger rubbing bilaterally. Uvula tongue midline.  head turning and shoulder shrug and were normal and symmetric.Tongue protrusion into cheek strength was normal.  Motor: normal tone, bulk and strength.  Sensory: Intact to fine touch, pinprick, preserved vibratory sensation, and proprioception at toes.  Coordination: Normal finger to nose, heel-to-shin bilaterally there was no truncal ataxia  Gait: Rising up from seated position without assistance, normal stance, without trunk ataxia, moderate stride, good arm swing, smooth turning, able to perform tiptoe, and heel walking without difficulty.   Romberg signs: Negative  Deep tendon reflexes: Brachioradialis 2/2, biceps 2/2, triceps 2/2, patellar 2/2, Achilles trace, plantar responses were flexor bilaterally.   DIAGNOSTIC DATA (LABS, IMAGING, TESTING) - I reviewed patient records, labs, notes, testing and imaging myself where available.  Lab Results  Component Value Date   WBC 8.1 11/19/2012   HGB 10.6* 11/19/2012   HCT 31.3* 11/19/2012   MCV 85.1 11/19/2012   PLT 187 11/19/2012      Component Value Date/Time   NA 134* 11/19/2012 0400   K 4.4 11/19/2012 0400   CL 101 11/19/2012 0400   CO2 26 11/19/2012 0400   GLUCOSE 125* 11/19/2012 0400   BUN 8 11/19/2012 0400   CREATININE 0.56 11/19/2012 0400   CALCIUM 8.7 11/19/2012 0400   GFRNONAA 86* 11/19/2012 0400   GFRAA >90 11/19/2012 0400    ASSESSMENT AND PLAN   79 Years old right-handed Caucasian female, presenting with two-month history of bilateral lower extremity paresthesia, burning sensation, she had a right knee replacement in July 2014, essentially normal neurological examination, no significant distal sensory loss.  1 differentiation diagnosis including small fiber neuropathy, lumbosacral radiculopathy or musculoskeletal etiology.  2.  complete evaluation with laboratory evaluation 3   increased  gabapentin to 300 mg 3 times a day  4.  if she is all better  on next followup in 4 monthlies Cardon, we do not have to initiate more extensive evaluation, if she remains symptomatic, may consider EMG nerve conduction study, skin biopsy,      Levert Feinstein, M.D. Ph.D.  [facility] [address] [place], [place] [address] [phone]

## 2013-01-14 LAB — THYROID PANEL WITH TSH
Free Thyroxine Index: 3 (ref 1.2–4.9)
T3 Uptake Ratio: 31 % (ref 24–39)
T4, Total: 9.6 ug/dL (ref 4.5–12.0)
TSH: 1.79 u[IU]/mL (ref 0.450–4.500)

## 2013-01-14 LAB — C-REACTIVE PROTEIN: CRP: 0.9 mg/L (ref 0.0–4.9)

## 2013-01-14 LAB — ANA W/REFLEX IF POSITIVE: Anti Nuclear Antibody(ANA): NEGATIVE

## 2013-01-14 LAB — SEDIMENTATION RATE: Sed Rate: 9 mm/hr (ref 0–40)

## 2013-01-18 NOTE — Progress Notes (Signed)
Attempted to call pt. No answer.

## 2013-01-21 NOTE — Progress Notes (Signed)
Quick Note:  Spoke to patient and relayed normal labs, per Dr. Yan. ______ 

## 2013-06-15 ENCOUNTER — Ambulatory Visit (INDEPENDENT_AMBULATORY_CARE_PROVIDER_SITE_OTHER): Payer: Medicare Other | Admitting: Neurology

## 2013-06-15 ENCOUNTER — Encounter (INDEPENDENT_AMBULATORY_CARE_PROVIDER_SITE_OTHER): Payer: Self-pay

## 2013-06-15 ENCOUNTER — Encounter: Payer: Self-pay | Admitting: Neurology

## 2013-06-15 VITALS — BP 153/76 | HR 82 | Ht 62.0 in | Wt 122.0 lb

## 2013-06-15 DIAGNOSIS — R209 Unspecified disturbances of skin sensation: Secondary | ICD-10-CM

## 2013-06-15 DIAGNOSIS — M4120 Other idiopathic scoliosis, site unspecified: Secondary | ICD-10-CM

## 2013-06-15 DIAGNOSIS — M419 Scoliosis, unspecified: Secondary | ICD-10-CM

## 2013-06-15 DIAGNOSIS — R202 Paresthesia of skin: Secondary | ICD-10-CM

## 2013-06-15 MED ORDER — ROPINIROLE HCL 1 MG PO TABS: ORAL_TABLET | ORAL | Status: DC

## 2013-06-15 NOTE — Progress Notes (Signed)
[facility]  PATIENT: Summer Davis DOB: 26-Sep-1930  HISTORICAL  INITIAL VISIT Sep 2014:   Summer Davis is a 80 years old right-handed Caucasian female, referred by her primary care physician Dr. Jena Gauss for evaluation of bilateral lower extremity burning pain.  She had past medical history of right knee replacement in July 2014, history of left hip replacement in 2000, previous history of headache, she has responded to gabapentin 300 mg every day, hypothyroidism, on supplement  She had laser surgery for bilateral lower extremity vein in July 2013, without any other clear trigger, around December 2013, she began to feel bilateral lower extremity burning sensation, starting from her knee, eventually involving bilateral feet top and bottom, constant pressure burning sensation, difficult to sleep, her symptoms improved with gabapentin 300 mg every night,  which she took at 3 PM, she denies significant gait difficulty, during that period of time, she also suffered severe right knee pain, midline low back pain,   Overall her symptoms have much improved, especially after her right knee replacement, she only has intermittent bilateral feet burning pain, no distal weakness or  distal sensory loss, she denies gait difficulty, she continues to complains of midline low back pain, no radiating pain to bilateral lower extremity no bowel or bladder incontinence, she denied bilateral upper extremity paresthesia  Laboratory evaluation showed normal CBC, CMP, TSH.  UPDATE Feb 3rd 2015: She felt unbalanced sensation with higher dose of gabapentin 300mg  tid. She is now taking at night time, otherwise she has difficulty sleepy, she has to get up, pacing around . She still has bilateral feet, anterior shin, leg burning sensation, no gait difficulty, terrible  midline low back pain,  no shooting pain, no incontinence.   REVIEW OF SYSTEMS: Full 14 system review of systems performed and  notable only for  joint pain, achy muscles, memory loss, headache, weakness, insomnia, restless legs, skin sensitivity  ALLERGIES: Allergies  Allergen Reactions  . Codeine   . Lipitor [Atorvastatin]   . Oxycodone Nausea And Vomiting  . Zetia [Ezetimibe]   . Zocor [Simvastatin]     HOME MEDICATIONS: Outpatient Prescriptions Prior to Visit  Medication Sig Dispense Refill  . aspirin 81 MG tablet Take 81 mg by mouth daily.      . calcium carbonate (TUMS - DOSED IN MG ELEMENTAL CALCIUM) 500 MG chewable tablet Chew 2 tablets by mouth daily.       Marland Kitchen CALCIUM-MAGNESIUM PO Take 300 tablets by mouth daily.       . cholecalciferol (VITAMIN D) 1000 UNITS tablet Take 1,000 Units by mouth daily.      Marland Kitchen docusate sodium 100 MG CAPS Take 100 mg by mouth 2 (two) times daily.  10 capsule  0  . glucosamine-chondroitin 500-400 MG tablet Take 2 tablets by mouth daily.      Marland Kitchen levothyroxine (SYNTHROID, LEVOTHROID) 75 MCG tablet Take 75 mcg by mouth every other day. SUN, Tuesday, Thursday, Sat      . levothyroxine (SYNTHROID, LEVOTHROID) 88 MCG tablet Take 88 mcg by mouth every other day. MON, WED, FRIDAY      . Multiple Vitamins-Minerals (ICAPS) CAPS Take 1 capsule by mouth 2 (two) times daily.      [name] 0.4-0.3 % SOLN Place 1 drop into both eyes daily as needed (for dry eyes).      Marland Kitchen acetaminophen (TYLENOL) 500 MG tablet Take 2 tablets (1,000 mg total) by mouth every 6 (six) hours as needed.  30 tablet  0  . gabapentin (NEURONTIN) 100 MG capsule Take 3 capsules (300 mg total) by mouth 3 (three) times daily.  270 capsule  12  . tiZANidine (ZANAFLEX) 4 MG capsule Take 1 capsule (4 mg total) by mouth 3 (three) times daily as needed for muscle spasms.  50 capsule  0  . zolpidem (AMBIEN CR) 6.25 MG CR tablet Take 6.25 mg by mouth at bedtime as needed for sleep.         PAST MEDICAL HISTORY: Past Medical History  Diagnosis Date  . H/O scoliosis   . GERD (gastroesophageal  reflux disease)   . Arthritis   . Hypercholesterolemia   . Hypothyroidism   . UTI (lower urinary tract infection)   . Tortuous colon   . Peripheral vascular disease     h/x varicose veins  . Insomnia   . PONV (postoperative nausea and vomiting)   . Esophageal reflux     PAST SURGICAL HISTORY: Past Surgical History  Procedure Laterality Date  . Total hip arthroplasty      Left  . Cholecystectomy    . Tonsillectomy and adenoidectomy    . Endoscopic vein laser treatment    . Eye muscle surgery Left   . Foot neuroma surgery Right   . Total knee arthroplasty Right 11/17/2012    Procedure: RIGHT TOTAL KNEE ARTHROPLASTY;  Surgeon: Shelda PalMatthew D Olin, MD;  Location: [facility];  Service: Orthopedics;  Laterality: Right;    FAMILY HISTORY: Family History  Problem Relation Age of Onset  . Heart disease Father     Later onset CAD and heart failure  . Cancer Mother     Liver and pancreas    SOCIAL HISTORY:  History   Social History  . Marital Status: Married    Spouse Name: Summer Davis    Number of Children: 4  . Years of Education: 12   Occupational History  .      Retired   Social History Main Topics  . Smoking status: Never Smoker   . Smokeless tobacco: Never Used  . Alcohol Use: No  . Drug Use: No  . Sexual Activity: Not on file   Other Topics Concern  . Not on file   Social History Narrative   Patient lives at home with her husband (Summer Davis). Patient is retired. High school education.   Right handed.   Caffeine- one cup coffee daily.   One child deceased.     PHYSICAL EXAM    Filed Vitals:   06/15/13 1110  BP: 153/76  Pulse: 82  Height: 5\' 2"  (1.575 m)  Weight: 122 lb (55.339 kg)    Body mass index is 22.31 kg/(m^2).   Generalized: In no acute distress  Neck: Supple, no carotid bruits   Cardiac: Regular rate rhythm  Pulmonary: Clear to auscultation bilaterally  Musculoskeletal: No deformity  Neurological examination  Mentation:  depressed  looking elderly female, alert oriented to time, place, history taking, and causual conversation  Cranial nerve II-XII: Pupils were equal round reactive to light extraocular movements were full, visual field were full on confrontational test. facial sensation and strength were normal. hearing was intact to finger rubbing bilaterally. Uvula tongue midline.  head turning and shoulder shrug and were normal and symmetric.Tongue protrusion into cheek strength was normal.  Motor: normal tone, bulk and strength.  Sensory: Intact to fine touch, pinprick, preserved vibratory sensation, and proprioception at toes.  Coordination: Normal finger to nose, heel-to-shin bilaterally there was no truncal ataxia  Gait: Rising up from seated position without assistance, normal stance, without trunk ataxia, moderate stride, good arm swing, smooth turning,  mild difficulty perform tiptoe, and heel walking without difficulty.   Romberg signs: Negative  Deep tendon reflexes: Brachioradialis 2/2, biceps 2/2, triceps 2/2, patellar 2/2, Achilles trace, plantar responses were flexor bilaterally.   DIAGNOSTIC DATA (LABS, IMAGING, TESTING) - I reviewed patient records, labs, notes, testing and imaging myself where available.  Lab Results  Component Value Date   WBC 8.1 11/19/2012   HGB 10.6* 11/19/2012   HCT 31.3* 11/19/2012   MCV 85.1 11/19/2012   PLT 187 11/19/2012      Component Value Date/Time   NA 134* 11/19/2012 0400   K 4.4 11/19/2012 0400   CL 101 11/19/2012 0400   CO2 26 11/19/2012 0400   GLUCOSE 125* 11/19/2012 0400   BUN 8 11/19/2012 0400   CREATININE 0.56 11/19/2012 0400   CALCIUM 8.7 11/19/2012 0400   GFRNONAA 86* 11/19/2012 0400   GFRAA >90 11/19/2012 0400    ASSESSMENT AND PLAN   80 Years old right-handed Caucasian female, presenting with two-month history of bilateral lower extremity paresthesia, burning sensation, she had a right knee replacement in July 2014, essentially normal neurological  examination, no significant distal sensory loss.  1 differentiation diagnosis including  restless leg syndrome, small fiber neuropathy, lumbosacral radiculopathy 2.   laboratory evaluation  3  add-on low-dose Requip 1 mg every night 4 EMG nerve conduction study, and skin biopsy.

## 2013-06-16 ENCOUNTER — Telehealth: Payer: Self-pay | Admitting: Neurology

## 2013-06-16 NOTE — Telephone Encounter (Signed)
Left message for patient to call and schedule nerve conduction and EMG study.

## 2013-06-17 LAB — IRON AND TIBC
Iron Saturation: 26 % (ref 15–55)
Iron: 80 ug/dL (ref 35–155)
TIBC: 312 ug/dL (ref 250–450)
UIBC: 232 ug/dL (ref 150–375)

## 2013-06-17 LAB — IFE AND PE, SERUM
Albumin SerPl Elph-Mcnc: 3.8 g/dL (ref 3.2–5.6)
Albumin/Glob SerPl: 1.7 (ref 0.7–2.0)
Alpha 1: 0.2 g/dL (ref 0.1–0.4)
Alpha2 Glob SerPl Elph-Mcnc: 0.6 g/dL (ref 0.4–1.2)
B-Globulin SerPl Elph-Mcnc: 0.8 g/dL (ref 0.6–1.3)
Gamma Glob SerPl Elph-Mcnc: 0.7 g/dL (ref 0.5–1.6)
Globulin, Total: 2.3 g/dL (ref 2.0–4.5)
IgA/Immunoglobulin A, Serum: 146 mg/dL (ref 91–414)
IgG (Immunoglobin G), Serum: 867 mg/dL (ref 700–1600)
IgM (Immunoglobulin M), Srm: 50 mg/dL (ref 40–230)
Total Protein: 6.1 g/dL (ref 6.0–8.5)

## 2013-06-17 LAB — CK: Total CK: 92 U/L (ref 24–173)

## 2013-06-17 LAB — VITAMIN B12: Vitamin B-12: 1185 pg/mL — ABNORMAL HIGH (ref 211–946)

## 2013-06-17 LAB — FOLATE: Folate: >19.9 ng/mL (ref >3.0)

## 2013-06-17 LAB — FERRITIN: Ferritin: 60 ng/mL (ref 15–150)

## 2013-06-17 LAB — RPR: RPR: NONREACTIVE

## 2013-06-17 LAB — C-REACTIVE PROTEIN: CRP: 0.3 mg/L (ref 0.0–4.9)

## 2013-06-17 LAB — SEDIMENTATION RATE: Sed Rate: 5 mm/hr (ref 0–40)

## 2013-06-17 LAB — ANA W/REFLEX IF POSITIVE: Anti Nuclear Antibody(ANA): NEGATIVE

## 2013-06-24 ENCOUNTER — Encounter: Payer: Medicare Other | Admitting: Neurology

## 2013-08-04 DIAGNOSIS — Z961 Presence of intraocular lens: Secondary | ICD-10-CM | POA: Insufficient documentation

## 2013-08-19 DIAGNOSIS — H353290 Exudative age-related macular degeneration, unspecified eye, stage unspecified: Secondary | ICD-10-CM | POA: Insufficient documentation

## 2014-05-13 HISTORY — PX: CATARACT EXTRACTION: SUR2

## 2014-07-28 ENCOUNTER — Other Ambulatory Visit: Payer: Self-pay | Admitting: Family Medicine

## 2014-07-28 ENCOUNTER — Ambulatory Visit
Admission: RE | Admit: 2014-07-28 | Discharge: 2014-07-28 | Disposition: A | Discharge status: Home / Self Care | Payer: Medicare Other | Source: Ambulatory Visit | Attending: Family Medicine | Admitting: Family Medicine

## 2014-07-28 DIAGNOSIS — J069 Acute upper respiratory infection, unspecified: Secondary | ICD-10-CM

## 2015-02-02 DIAGNOSIS — H43812 Vitreous degeneration, left eye: Secondary | ICD-10-CM | POA: Insufficient documentation

## 2015-06-06 ENCOUNTER — Ambulatory Visit (INDEPENDENT_AMBULATORY_CARE_PROVIDER_SITE_OTHER): Payer: Medicare Other | Admitting: Neurology

## 2015-06-06 ENCOUNTER — Encounter: Payer: Self-pay | Admitting: Neurology

## 2015-06-06 VITALS — Ht 62.0 in | Wt 120.0 lb

## 2015-06-06 DIAGNOSIS — R413 Other amnesia: Secondary | ICD-10-CM | POA: Diagnosis not present

## 2015-06-06 NOTE — Progress Notes (Signed)
PATIENT: Summer Davis DOB: [birth year]  Chief Complaint  Patient presents with  . Memory Loss    MMSE 29/30 - 18 animals.  She is here today to discuss her memory difficulty.  She has noticed a decline over the last couple of years.  . Perripheral neuropathy    She is still uncomfortable at times.  She is currently using gabapentin , one tablet in am and three tablets in pm.     HISTORICAL  Summer Davis is 82 years old right-handed female, seen in refer by her primary care physician Dr. Tacey Heap for evaluation of memory loss in June 06 2015  She had past medical history of right knee replacement in July 2014, history of left hip replacement in 2000, previous history of headache, she has responded to gabapentin 300 mg every day, hypothyroidism, on supplement, I saw her in 2015 for evaluation of bilateral lower extremity paresthesia.  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  Her father's sister has mild memeory trouble in their 90s.  She has mild memory trouble since 2015, she does not lost while driving, no trouble with bills, she misplace things, complains of word finding difficulty, occasionally loosing things. She has chronic insomnia, worsening memory trouble the day after she had a rough night, she denies significant gait difficulty, she has good appetite, exercise regularly.  I reviewed laboratory evaluation since 2016, normal CMP, TSH was normal 1.89, normal vitamin D, normal CBC, LDL was elevated 139,  REVIEW OF SYSTEMS: Full 14 system review of systems performed and notable only for fatigue, easy bruising, joint pain, cramps, achy muscles, ringing ears, spinning sensation, memory loss, confusion, headaches, dizziness, tremor, insomnia, restless  leg, depression, anxiety  ALLERGIES: Allergies  Allergen Reactions  . Alendronate Sodium   . Codeine   . Lipitor [Atorvastatin]   . Oxycodone Nausea And Vomiting  . Zetia [Ezetimibe]   . Zocor [Simvastatin]     HOME MEDICATIONS: Current Outpatient Prescriptions  Medication Sig Dispense Refill  . Apoaequorin (PREVAGEN PO) Take by mouth daily.    Marland Kitchen aspirin 81 MG tablet Take 81 mg by mouth daily.    . calcium carbonate (TUMS - DOSED IN MG ELEMENTAL CALCIUM) 500 MG chewable tablet Chew 2 tablets by mouth daily.     Marland Kitchen CALCIUM-MAGNESIUM PO Take 300 tablets by mouth daily.     . cholecalciferol (VITAMIN D) 1000 UNITS tablet Take 1,000 Units by mouth daily.    Marland Kitchen docusate sodium 100 MG CAPS Take 100 mg by mouth 2 (two) times daily. 10 capsule 0  . gabapentin (NEURONTIN) 100 MG capsule TK 1 C PO IN THE MORNING AND 3 CS PO IN THE EVE UTD  5  . levothyroxine (SYNTHROID, LEVOTHROID) 75 MCG tablet Take 75 mcg by mouth every other day. SUN, Tuesday, Thursday, Sat    . levothyroxine (SYNTHROID, LEVOTHROID) 88 MCG tablet Take 88 mcg by mouth every other day. MON, WED, FRIDAY    . Multiple Vitamins-Minerals (ICAPS) CAPS Take 1 capsule by mouth 2 (two) times daily.    [name] 0.4-0.3 % SOLN Place 1 drop into both eyes daily as needed (for dry eyes).     No current facility-administered medications for this visit.    PAST MEDICAL  HISTORY: Past Medical History  Diagnosis Date  . H/O scoliosis   . GERD (gastroesophageal reflux disease)   . Arthritis   . Hypercholesterolemia   . Hypothyroidism   . UTI (lower urinary tract infection)   . Tortuous colon   . Peripheral vascular disease (HCC)     h/x varicose veins  . Insomnia   . PONV (postoperative nausea and vomiting)   . Esophageal reflux   . Memory difficulty     PAST SURGICAL HISTORY: Past Surgical History  Procedure Laterality Date  . Total hip arthroplasty      Left  . Cholecystectomy    .  Tonsillectomy and adenoidectomy    . Endoscopic vein laser treatment    . Eye muscle surgery Left   . Foot neuroma surgery Right   . Total knee arthroplasty Right 11/17/2012    Procedure: RIGHT TOTAL KNEE ARTHROPLASTY;  Surgeon: [name], MD;  Location: [facility];  Service: Orthopedics;  Laterality: Right;  . Cataract extraction Bilateral     FAMILY HISTORY: Family History  Problem Relation Age of Onset  . Heart disease Father     Later onset CAD and heart failure  . Cancer Mother     Liver and pancreas    SOCIAL HISTORY:  Social History   Social History  . Marital Status: Married    Spouse Name: Winfred  . Number of Children: 4  . Years of Education: 12   Occupational History  .      Retired   Social History Main Topics  . Smoking status: Never Smoker   . Smokeless tobacco: Never Used  . Alcohol Use: No  . Drug Use: No  . Sexual Activity: Not on file   Other Topics Concern  . Not on file   Social History Narrative   Patient lives at home with her husband (Winfred). Patient is retired. High school education.   Right handed.   Caffeine- one cup coffee daily.   One child deceased.     PHYSICAL EXAM   Filed Vitals:   06/06/15 1444  Height:  (1.575 m)  Weight: 120 lb (54.432 kg)    Not recorded      Body mass index is 21.94 kg/(m^2).  PHYSICAL EXAMNIATION:  Gen: NAD, conversant, well nourised, obese, well groomed                     Cardiovascular: Regular rate rhythm, no peripheral edema, warm, nontender. Eyes: Conjunctivae clear without exudates or hemorrhage Neck: Supple, no carotid bruise. Pulmonary: Clear to auscultation bilaterally   NEUROLOGICAL EXAM:  MENTAL STATUS: Speech:    Speech is normal; fluent and spontaneous with normal comprehension.  Cognition: Mini-Mental Status Examination is 29 out of 30, animal naming was 18     Orientation to time, place and person     Recent and remote memory: She missed one out of 3 recalls      Normal Attention span and concentration     Normal Language, naming, repeating,spontaneous speech     Fund of knowledge   CRANIAL NERVES: CN II: Visual fields are full to confrontation. Fundoscopic exam is normal with sharp discs and no vascular changes. Pupils are round equal and briskly reactive to light. CN III, IV, VI: extraocular movement are normal. No ptosis. CN V: Facial sensation is intact to pinprick in all 3 divisions bilaterally. Corneal responses are intact.  CN VII: Face is symmetric with normal eye closure and smile.  CN VIII: Hearing is normal to rubbing fingers CN IX, X: Palate elevates symmetrically. Phonation is normal. CN XI: Head turning and shoulder shrug are intact CN XII: Tongue is midline with normal movements and no atrophy.  MOTOR: There is no pronator drift of out-stretched arms. Muscle bulk and tone are normal. Muscle strength is normal.  REFLEXES: Reflexes are 2+ and symmetric at the biceps, triceps, knees, and ankles. Plantar responses are flexor.  SENSORY: Intact to light touch, pinprick, position sense, and vibration sense are intact in fingers and toes.  COORDINATION: Rapid alternating movements and fine finger movements are intact. There is no dysmetria on finger-to-nose and heel-knee-shin.    GAIT/STANCE: Posture is normal. Gait is steady with normal steps, base, arm swing, and turning. Heel and toe walking are normal. Tandem gait is normal.  Romberg is absent.   DIAGNOSTIC DATA (LABS, IMAGING, TESTING) - I reviewed patient records, labs, notes, testing and imaging myself where available.   ASSESSMENT AND PLAN  Erick Oxendine is a 82 y.o. female   Memory loss  Mini-Mental Status Examination is 51 out of 30  No treatable etiology found on recent laboratory evaluation  I have suggested patient continue moderate exercise,  B12 level at her next yearly follow-up,  She wants to hold of brain MRI at this point   Levert Feinstein, M.D.  Ph.D.  [facility] [address] [place], [place] [address] Ph: [phone] Fax: [phone]  CC: Referring Provider

## 2015-10-29 ENCOUNTER — Emergency Department (HOSPITAL_COMMUNITY): Payer: Medicare Other

## 2015-10-29 ENCOUNTER — Emergency Department (HOSPITAL_COMMUNITY)
Admission: EM | Admit: 2015-10-29 | Discharge: 2015-10-30 | Disposition: A | Discharge status: Home / Self Care | Payer: Medicare Other | Attending: Emergency Medicine | Admitting: Emergency Medicine

## 2015-10-29 ENCOUNTER — Encounter (HOSPITAL_COMMUNITY): Payer: Self-pay | Admitting: *Deleted

## 2015-10-29 DIAGNOSIS — W0110XA Fall on same level from slipping, tripping and stumbling with subsequent striking against unspecified object, initial encounter: Secondary | ICD-10-CM | POA: Diagnosis not present

## 2015-10-29 DIAGNOSIS — Z79899 Other long term (current) drug therapy: Secondary | ICD-10-CM | POA: Insufficient documentation

## 2015-10-29 DIAGNOSIS — E039 Hypothyroidism, unspecified: Secondary | ICD-10-CM | POA: Insufficient documentation

## 2015-10-29 DIAGNOSIS — W19XXXA Unspecified fall, initial encounter: Secondary | ICD-10-CM

## 2015-10-29 DIAGNOSIS — Y939 Activity, unspecified: Secondary | ICD-10-CM | POA: Insufficient documentation

## 2015-10-29 DIAGNOSIS — I739 Peripheral vascular disease, unspecified: Secondary | ICD-10-CM | POA: Diagnosis not present

## 2015-10-29 DIAGNOSIS — R51 Headache: Secondary | ICD-10-CM | POA: Diagnosis not present

## 2015-10-29 DIAGNOSIS — Y999 Unspecified external cause status: Secondary | ICD-10-CM | POA: Insufficient documentation

## 2015-10-29 DIAGNOSIS — Z7982 Long term (current) use of aspirin: Secondary | ICD-10-CM | POA: Insufficient documentation

## 2015-10-29 DIAGNOSIS — M25561 Pain in right knee: Secondary | ICD-10-CM

## 2015-10-29 DIAGNOSIS — Y929 Unspecified place or not applicable: Secondary | ICD-10-CM | POA: Insufficient documentation

## 2015-10-29 MED ORDER — NAPROXEN 500 MG PO TABS
500.0000 mg | ORAL_TABLET | Freq: Once | ORAL | Status: AC
  Administered 2015-10-30: 500 mg via ORAL
  Filled 2015-10-29: qty 1

## 2015-10-29 MED ORDER — ACETAMINOPHEN 500 MG PO TABS
1000.0000 mg | ORAL_TABLET | Freq: Once | ORAL | Status: AC
  Administered 2015-10-30: 1000 mg via ORAL
  Filled 2015-10-29: qty 2

## 2015-10-29 NOTE — ED Notes (Signed)
Pt back from x-ray.

## 2015-10-29 NOTE — ED Provider Notes (Signed)
CSN: [id number]     Arrival date & time 10/29/15  2131 History  By signing my name below, I, Summer Davis & Summer Davis, attest that this documentation has been prepared under the direction and in the presence of Melene Plan, DO. Electronically Signed: Myles Davis; Summer Davis, ED Scribes. 10/30/2015. 1:17 AM.    Chief Complaint  Patient presents with  . Fall  . Knee Pain  . Head Injury    The history is provided by the patient. No language interpreter was used.    HPI Comments: Summer Davis is a 82 y.o. female with a PSHx of a right knee replacement who presents to the Emergency Department s/p mechanical fall 3 hours ago complaining of moderate pain to her posterior head and right knee following the incident. The pt states that she fell on her porch where she struck her head and knee. Pt notes she takes Gabapentin which she took prior to fall; no other pain meds taken PTA. She is usually takes aspirin daily but has not taken it for 6 days as she has an upcoming dental appointment. She denies loss of consciousness, abdominal tenderness, and CP. Pt ambulates with a walker.   Past Medical History  Diagnosis Date  . H/O scoliosis   . GERD (gastroesophageal reflux disease)   . Arthritis   . Hypercholesterolemia   . Hypothyroidism   . UTI (lower urinary tract infection)   . Tortuous colon   . Peripheral vascular disease (HCC)     h/x varicose veins  . Insomnia   . PONV (postoperative nausea and vomiting)   . Esophageal reflux   . Memory difficulty    Past Surgical History  Procedure Laterality Date  . Total hip arthroplasty      Left  . Cholecystectomy    . Tonsillectomy and adenoidectomy    . Endoscopic vein laser treatment    . Eye muscle surgery Left   . Foot neuroma surgery Right   . Total knee arthroplasty Right 11/17/2012    Procedure: RIGHT TOTAL KNEE ARTHROPLASTY;  Surgeon: [name], MD;  Location: [facility];  Service: Orthopedics;  Laterality: Right;  .  Cataract extraction Bilateral    Family History  Problem Relation Age of Onset  . Heart disease Father     Later onset CAD and heart failure  . Cancer Mother     Liver and pancreas   Social History  Substance Use Topics  . Smoking status: Never Smoker   . Smokeless tobacco: Never Used  . Alcohol Use: No   OB History    No data available     Review of Systems  Cardiovascular: Negative for chest pain.  Gastrointestinal: Negative for abdominal pain.  Musculoskeletal: Positive for myalgias and arthralgias.  Neurological: Positive for headaches (posterior head pain).  All other systems reviewed and are negative.     Allergies  Alendronate sodium; Codeine; Lipitor; Oxycodone; Zetia; and Zocor  Home Medications   Prior to Admission medications   Medication Sig Start Date End Date Taking? Authorizing Provider  Apoaequorin (PREVAGEN PO) Take by mouth daily.   Yes Historical Provider, MD  aspirin 81 MG tablet Take 81 mg by mouth daily.   Yes Historical Provider, MD  calcium carbonate (TUMS - DOSED IN MG ELEMENTAL CALCIUM) 500 MG chewable tablet Chew 2 tablets by mouth daily.    Yes Historical Provider, MD  CALCIUM-MAGNESIUM PO Take 300 tablets by mouth daily.    Yes Historical Provider, MD  cholecalciferol (  VITAMIN D) 1000 UNITS tablet Take 1,000 Units by mouth daily.   Yes Historical Provider, MD  DOCOSAHEXAENOIC ACID PO Take 1 capsule by mouth 2 (two) times daily.   Yes Historical Provider, MD  docusate sodium 100 MG CAPS Take 100 mg by mouth 2 (two) times daily. 11/19/12  Yes Matthew Babish, PA-C  gabapentin (NEURONTIN) 100 MG capsule TK 1 Cap PO IN THE MORNING AND 3 Caps PO IN THE EVE UTD 04/24/15  Yes Historical Provider, MD  levothyroxine (SYNTHROID, LEVOTHROID) 75 MCG tablet Take 75 mcg by mouth every other day. SUN, Tuesday, Thursday, Sat   Yes Historical Provider, MD  levothyroxine (SYNTHROID, LEVOTHROID) 88 MCG tablet Take 88 mcg by mouth every other day. MON, WED, FRIDAY    Yes Historical Provider, MD  Polyethyl Glycol-Propyl Glycol (SYSTANE) 0.4-0.3 % SOLN Place 1 drop into both eyes daily as needed (for dry eyes).   Yes Historical Provider, MD   BP 175/77 mmHg  Pulse 87  Temp(Src) 97.8 F (36.6 C) (Oral)  Resp 18  SpO2 95% Physical Exam  Constitutional: She is oriented to person, place, and time. She appears well-developed and well-nourished. No distress.  HENT:  Head: Normocephalic and atraumatic.  Eyes: EOM are normal. Pupils are equal, round, and reactive to light.  Neck: Normal range of motion. Neck supple.  Cardiovascular: Normal rate and regular rhythm.  Exam reveals no gallop and no friction rub.   No murmur heard. Pulmonary/Chest: Effort normal. She has no wheezes. She has no rales. She exhibits no tenderness.  Abdominal: Soft. She exhibits no distension. There is no tenderness.  Musculoskeletal: She exhibits edema. She exhibits no tenderness.  Swelling to right knee; no effusion Palpable pulses noted  Neurological: She is alert and oriented to person, place, and time.  Motor/sensation intact  Skin: Skin is warm and dry. She is not diaphoretic.  Hematoma to left occiput  Psychiatric: She has a normal mood and affect. Her behavior is normal.  Nursing note and vitals reviewed.   ED Course  Procedures  DIAGNOSTIC STUDIES:  Oxygen Saturation is 95% on RA, adequate by my interpretation.    COORDINATION OF CARE:  11:26 PM Discussed treatment plan with pt at bedside and pt agreed to plan.  Labs Review Labs Reviewed - No data to display  Imaging Review Ct Head Wo Contrast  10/30/2015  CLINICAL DATA:  Status post fall on porch, with concern for head injury. Initial encounter. EXAM: CT HEAD WITHOUT CONTRAST CT CERVICAL SPINE WITHOUT CONTRAST TECHNIQUE: Multidetector CT imaging of the head and cervical spine was performed following the standard protocol without intravenous contrast. Multiplanar CT image reconstructions of the cervical spine  were also generated. COMPARISON:  CT of the head performed 03/10/2006, and MRI of the brain performed 02/10/2008 FINDINGS: CT HEAD FINDINGS There is no evidence of acute infarction, mass lesion, or intra- or extra-axial hemorrhage on CT. Prominence of ventricles and sulci reflects mild to moderate cortical volume loss. Mild cerebellar atrophy is noted. Scattered periventricular and subcortical white matter change likely reflects small vessel ischemic microangiopathy. The brainstem and fourth ventricle are within normal limits. The basal ganglia are unremarkable in appearance. The cerebral hemispheres demonstrate grossly normal gray-white differentiation. No mass effect or midline shift is seen. There is no evidence of fracture; visualized osseous structures are unremarkable in appearance. The orbits are within normal limits. The paranasal sinuses and mastoid air cells are well-aerated. No significant soft tissue abnormalities are seen. CT CERVICAL SPINE FINDINGS There is no  evidence of fracture or subluxation. Vertebral bodies demonstrate normal height and alignment. Multilevel disc space narrowing is noted along the lower cervical spine, with a few small posterior disc osteophyte complexes and underlying facet disease. Prevertebral soft tissues are within normal limits. The thyroid gland is unremarkable in appearance. Scarring is noted at the lung apices. No significant soft tissue abnormalities are seen. IMPRESSION: 1. No evidence of traumatic intracranial injury or fracture. 2. No evidence of fracture or subluxation along the cervical spine. 3. Mild to moderate cortical volume loss and scattered small vessel ischemic microangiopathy. 4. Mild degenerative change along the lower cervical spine. 5. Scarring noted at the lung apices. Electronically Signed   By: Roanna Raider M.D.   On: 10/30/2015 01:03   Ct Cervical Spine Wo Contrast  10/30/2015  CLINICAL DATA:  Status post fall on porch, with concern for head  injury. Initial encounter. EXAM: CT HEAD WITHOUT CONTRAST CT CERVICAL SPINE WITHOUT CONTRAST TECHNIQUE: Multidetector CT imaging of the head and cervical spine was performed following the standard protocol without intravenous contrast. Multiplanar CT image reconstructions of the cervical spine were also generated. COMPARISON:  CT of the head performed 03/10/2006, and MRI of the brain performed 02/10/2008 FINDINGS: CT HEAD FINDINGS There is no evidence of acute infarction, mass lesion, or intra- or extra-axial hemorrhage on CT. Prominence of ventricles and sulci reflects mild to moderate cortical volume loss. Mild cerebellar atrophy is noted. Scattered periventricular and subcortical white matter change likely reflects small vessel ischemic microangiopathy. The brainstem and fourth ventricle are within normal limits. The basal ganglia are unremarkable in appearance. The cerebral hemispheres demonstrate grossly normal gray-white differentiation. No mass effect or midline shift is seen. There is no evidence of fracture; visualized osseous structures are unremarkable in appearance. The orbits are within normal limits. The paranasal sinuses and mastoid air cells are well-aerated. No significant soft tissue abnormalities are seen. CT CERVICAL SPINE FINDINGS There is no evidence of fracture or subluxation. Vertebral bodies demonstrate normal height and alignment. Multilevel disc space narrowing is noted along the lower cervical spine, with a few small posterior disc osteophyte complexes and underlying facet disease. Prevertebral soft tissues are within normal limits. The thyroid gland is unremarkable in appearance. Scarring is noted at the lung apices. No significant soft tissue abnormalities are seen. IMPRESSION: 1. No evidence of traumatic intracranial injury or fracture. 2. No evidence of fracture or subluxation along the cervical spine. 3. Mild to moderate cortical volume loss and scattered small vessel ischemic  microangiopathy. 4. Mild degenerative change along the lower cervical spine. 5. Scarring noted at the lung apices. Electronically Signed   By: Roanna Raider M.D.   On: 10/30/2015 01:03   Dg Knee Complete 4 Views Right  10/30/2015  CLINICAL DATA:  Acute onset of right knee pain after slipping and falling, with soft tissue swelling. Initial encounter. EXAM: RIGHT KNEE - COMPLETE 4+ VIEW COMPARISON:  Right knee radiographs performed 12/24/2007, and MRI of the right knee performed 02/22/2008. FINDINGS: There is no evidence of fracture or dislocation. The patient's total knee arthroplasty is grossly unremarkable in appearance, without evidence of loosening. The patellar component is not well assessed, but appears grossly unremarkable. A small to moderate knee joint effusion is noted. IMPRESSION: 1. No evidence of fracture or dislocation. Total knee arthroplasty appears grossly intact, without evidence of loosening. 2. Small to moderate knee joint effusion noted. Electronically Signed   By: Roanna Raider M.D.   On: 10/30/2015 00:19   I have personally  reviewed and evaluated these images and lab results as part of my medical decision-making.   EKG Interpretation None      MDM   Final diagnoses:  Fall, initial encounter  Right knee pain    82 yo F With a mechanical fall. CT of the head is negative. The patient is on aspirin but has not taken it in 5 days. X-ray of the right knee with no findings of fracture. Patient does have an effusion on exam. Will place her in a knee immobilizer. Have her follow-up with her orthopedic surgeon.  I personally performed the services described in this documentation, which was scribed in my presence. The recorded information has been reviewed and is accurate.    1:22 AM:  I have discussed the diagnosis/risks/treatment options with the patient and family and believe the pt to be eligible for discharge home to follow-up with Ortho. We also discussed returning to the  ED immediately if new or worsening sx occur. We discussed the sx which are most concerning (e.g., sudden worsening pain, inability to walk) that necessitate immediate return. Medications administered to the patient during their visit and any new prescriptions provided to the patient are listed below.  Medications given during this visit Medications  naproxen (NAPROSYN) tablet 500 mg (500 mg Oral Given 10/30/15 0025)  acetaminophen (TYLENOL) tablet 1,000 mg (1,000 mg Oral Given 10/30/15 0025)    New Prescriptions   No medications on file    The patient appears reasonably screen and/or stabilized for discharge and I doubt any other medical condition or other [facility] requiring further screening, evaluation, or treatment in the ED at this time prior to discharge.     Melene Planan Ousmane Seeman, DO 10/30/15 [id number]

## 2015-10-29 NOTE — ED Notes (Addendum)
Pt complains of posterior head pain and right knee pain since slipping and  falling tonight at 8PM. Pt has hx of right knee replacement. Pt iced her knee and put on a knee sleeve, states pain is 10/10 while bearing weight. Pt denies loss of consciousness, denies use of blood thinners.

## 2015-10-30 DIAGNOSIS — M25561 Pain in right knee: Secondary | ICD-10-CM | POA: Diagnosis not present

## 2015-10-30 NOTE — Discharge Instructions (Signed)
Fall Prevention in the Home  Falls can cause injuries and can affect people from all age groups. There are many simple things that you can do to make your home safe and to help prevent falls. WHAT CAN I DO ON THE OUTSIDE OF MY HOME?  Regularly repair the edges of walkways and driveways and fix any cracks.  Remove high doorway thresholds.  Trim any shrubbery on the main path into your home.  Use bright outdoor lighting.  Clear walkways of debris and clutter, including tools and rocks.  Regularly check that handrails are securely fastened and in good repair. Both sides of any steps should have handrails.  Install guardrails along the edges of any raised decks or porches.  Have leaves, snow, and ice cleared regularly.  Use sand or salt on walkways during winter months.  In the garage, clean up any spills right away, including grease or oil spills. WHAT CAN I DO IN THE BATHROOM?  Use night lights.  Install grab bars by the toilet and in the tub and shower. Do not use towel bars as grab bars.  Use non-skid mats or decals on the floor of the tub or shower.  If you need to sit down while you are in the shower, use a plastic, non-slip stool..  Keep the floor dry. Immediately clean up any water that spills on the floor.  Remove soap buildup in the tub or shower on a regular basis.  Attach bath mats securely with double-sided non-slip rug tape.  Remove throw rugs and other tripping hazards from the floor. WHAT CAN I DO IN THE BEDROOM?  Use night lights.  Make sure that a bedside light is easy to reach.  Do not use oversized bedding that drapes onto the floor.  Have a firm chair that has side arms to use for getting dressed.  Remove throw rugs and other tripping hazards from the floor. WHAT CAN I DO IN THE KITCHEN?   Clean up any spills right away.  Avoid walking on wet floors.  Place frequently used items in easy-to-reach places.  If you need to reach for something  above you, use a sturdy step stool that has a grab bar.  Keep electrical cables out of the way.  Do not use floor polish or wax that makes floors slippery. If you have to use wax, make sure that it is non-skid floor wax.  Remove throw rugs and other tripping hazards from the floor. WHAT CAN I DO IN THE STAIRWAYS?  Do not leave any items on the stairs.  Make sure that there are handrails on both sides of the stairs. Fix handrails that are broken or loose. Make sure that handrails are as long as the stairways.  Check any carpeting to make sure that it is firmly attached to the stairs. Fix any carpet that is loose or worn.  Avoid having throw rugs at the top or bottom of stairways, or secure the rugs with carpet tape to prevent them from moving.  Make sure that you have a light switch at the top of the stairs and the bottom of the stairs. If you do not have them, have them installed. WHAT ARE SOME OTHER FALL PREVENTION TIPS?  Wear closed-toe shoes that fit well and support your feet. Wear shoes that have rubber soles or low heels.  When you use a stepladder, make sure that it is completely opened and that the sides are firmly locked. Have someone hold the ladder while you   are using it. Do not climb a closed stepladder.  Add color or contrast paint or tape to grab bars and handrails in your home. Place contrasting color strips on the first and last steps.  Use mobility aids as needed, such as canes, walkers, scooters, and crutches.  Turn on lights if it is dark. Replace any light bulbs that burn out.  Set up furniture so that there are clear paths. Keep the furniture in the same spot.  Fix any uneven floor surfaces.  Choose a carpet design that does not hide the edge of steps of a stairway.  Be aware of any and all pets.  Review your medicines with your healthcare provider. Some medicines can cause dizziness or changes in blood pressure, which increase your risk of falling. Talk  with your health care provider about other ways that you can decrease your risk of falls. This may include working with a physical therapist or trainer to improve your strength, balance, and endurance.   This information is not intended to replace advice given to you by your health care provider. Make sure you discuss any questions you have with your health care provider.   Document Released: 04/19/2002 Document Revised: 09/13/2014 Document Reviewed: 06/03/2014 Elsevier Interactive Patient Education 2016 Elsevier Inc.  

## 2016-09-25 ENCOUNTER — Ambulatory Visit
Admission: RE | Admit: 2016-09-25 | Discharge: 2016-09-25 | Disposition: A | Discharge status: Home / Self Care | Payer: Medicare Other | Source: Ambulatory Visit | Attending: Family Medicine | Admitting: Family Medicine

## 2016-09-25 ENCOUNTER — Other Ambulatory Visit: Payer: Self-pay | Admitting: Family Medicine

## 2016-09-25 DIAGNOSIS — R0600 Dyspnea, unspecified: Secondary | ICD-10-CM

## 2016-09-25 DIAGNOSIS — R0609 Other forms of dyspnea: Principal | ICD-10-CM

## 2016-10-03 ENCOUNTER — Telehealth: Payer: Self-pay | Admitting: *Deleted

## 2016-10-03 NOTE — Telephone Encounter (Signed)
NOTES SENT TO SCHEDULING.  °

## 2016-10-10 ENCOUNTER — Telehealth: Payer: Self-pay

## 2016-10-10 NOTE — Telephone Encounter (Signed)
Sent notes to scheduling 

## 2016-10-16 ENCOUNTER — Telehealth: Payer: Self-pay | Admitting: Cardiology

## 2016-10-16 NOTE — Telephone Encounter (Signed)
Received records from [facility] for appointment on 10/31/16 with Dr Herbie BaltimoreHarding.  Records put with Dr Elissa HeftyHarding's schedule for 10/31/16. lp

## 2016-10-31 ENCOUNTER — Ambulatory Visit (INDEPENDENT_AMBULATORY_CARE_PROVIDER_SITE_OTHER): Payer: Medicare Other | Admitting: Cardiology

## 2016-10-31 VITALS — BP 143/77 | HR 71 | Ht 62.0 in | Wt 114.0 lb

## 2016-10-31 DIAGNOSIS — R0600 Dyspnea, unspecified: Secondary | ICD-10-CM | POA: Insufficient documentation

## 2016-10-31 DIAGNOSIS — R6889 Other general symptoms and signs: Secondary | ICD-10-CM | POA: Diagnosis not present

## 2016-10-31 DIAGNOSIS — R012 Other cardiac sounds: Secondary | ICD-10-CM | POA: Insufficient documentation

## 2016-10-31 DIAGNOSIS — R079 Chest pain, unspecified: Secondary | ICD-10-CM | POA: Diagnosis not present

## 2016-10-31 DIAGNOSIS — R0609 Other forms of dyspnea: Secondary | ICD-10-CM

## 2016-10-31 DIAGNOSIS — I10 Essential (primary) hypertension: Secondary | ICD-10-CM | POA: Insufficient documentation

## 2016-10-31 NOTE — Patient Instructions (Signed)
SCHEDULE AT [address] Your physician has requested that you have cardiac CT. Cardiac computed tomography (CT) is a painless test that uses an x-ray machine to take clear, detailed pictures of your heart. For further information please visit [email or web address] Please follow instruction sheet as given.     .Your physician recommends that you schedule a follow-up appointment in 2 MONTHS WITH DR HARDING.

## 2016-10-31 NOTE — Progress Notes (Signed)
PCP: Tally Joe, MD Orthopedics: Dr. August Saucer Ophthalmologist: Dr. Michelene Heady & [place] [facility])  Clinic Note: Chief Complaint  Patient presents with  . New Evaluation    previous pt of DR. Hochrein, pt c/o DOE/ SOB    HPI: Summer Davis is a 83 y.o. female who is being seen today for the evaluation of SHORTNESS OF BREATH at the request of Deatra James, MD.  She was last seen by Dr. Antoine Poche on 02/28/2012. This was evaluated with chest discomfort. Chest discomfort thought to be atypical in nature. Lexiscan Myoview was ordered.  Melisse Caetano was seen on May 24 by Dr. Wynelle Link --> she noted intermittent shortness of breath for the last 4-5 months. When she mentioned that back in December January timeframe, she had just lost her 2nd husband (died Last 05/18/23- was worn out from frequent trips to the hospital) and felt that she was simply overexerting herself at the time running back and forth. Most recently she noted it walking uphill to her neighbor's house to help out caring for her neighbor. Because of some occasional pressure in the chest, she decided to return and request referral back to cardiology (at the prompting of her children).  Recent Hospitalizations: None  Studies Personally Reviewed - (if available, images/films reviewed: From Epic Chart or Care Everywhere)  Lexiscan Myoview 03/06/2012: No evidence of ischemia or infarction. Normal ejection fraction.  Interval History: Basically, Summer Davis presents today noting exertional dyspnea walking up a slight uphill grade 2 her neighbor's house about a half block away. Walking that distance used to be nothing for her, but now she notes dyspnea as well as occasional pressure in the chest wall during this walk. Initially she felt like the dyspnea was because she was out of shape and deconditioning, but it has gotten worse now. She thought that chest discomfort was potentially GERD, but it only happened when she was walking. She would rest 5-10  minutes and then the symptoms would abate. All told she's had maybe 3 episodes of this chest discomfort. Not every time she walks.  Going back to 05/31/2022 after the death of her husband, she noted that her symptoms that really began at that time. She thought was simply related to exhaustion, but indicated that she would get sob going to bathroom @ night.  she is now noticing that same symptom. Now, she occasionally has to take deep breath @ rest & with showers.   she has also started note some intermittent Palpitations - ~1/week feels like heart racing, ~ 1 min @ most (chronic) - no associated Sx. Interestingly, with her having those 3 episodes of chest discomfort noted above, she also has noted some Rare "heaviness" every now & then -->  however she also notes that she can only do yard work for ~30 min @ a time having previously been able to do it for longer.   No PND, orthopnea or edema.  No lightheadedness, dizziness, weakness or syncope/near syncope. - if stands for a long time, may get dizzy b/c back pain. No TIA/amaurosis fugax symptoms. No melena, hematochezia, hematuria, or epstaxis. No claudication.  Does water aerobics - only SOB when HR goes up high.   ROS: A comprehensive was performed. Review of Systems  Constitutional: Positive for weight loss (This has lost her appetite. She is not enjoying eating by herself). Negative for malaise/fatigue.  HENT: Negative for nosebleeds.   Respiratory: Positive for wheezing (from allergens).   Cardiovascular: Positive for palpitations.  Gastrointestinal: Negative  for abdominal pain, blood in stool, nausea and vomiting.  Genitourinary: Negative for flank pain and hematuria.  Musculoskeletal: Positive for back pain (chronic) and joint pain (knees & ankles). Negative for myalgias (night cramps).  Skin: Negative.   Neurological: Positive for dizziness (if standing for a long while b/c back pain). Negative for focal weakness.  Endo/Heme/Allergies:  Bruises/bleeds easily.       Cold intolerant  Psychiatric/Behavioral: Negative for depression (doing well dealing with death of her husband) and memory loss. The patient has insomnia (She has not slept well however since the loss of her husband). The patient is not nervous/anxious.   All other systems reviewed and are negative.   I have reviewed and (if needed) personally updated the patient's problem list, medications, allergies, past medical and surgical history, social and family history.   Past Medical History:  Diagnosis Date  . Arthritis   . DOE (dyspnea on exertion)    Evaluated in 2013 with a Myoview that was negative for ischemia  . Esophageal reflux   . GERD (gastroesophageal reflux disease)   . H/O scoliosis   . Hypercholesterolemia   . Hypothyroidism   . Insomnia   . Lichen sclerosus   . Memory difficulty   . PONV (postoperative nausea and vomiting)   . Tortuous colon   . UTI (lower urinary tract infection)   . Varicose veins of both lower extremities with pain    h/x varicose veins - with endoscopic vein ablation    Past Surgical History:  Procedure Laterality Date  . CATARACT EXTRACTION Bilateral 2016   Left-sided surgery was unsuccessful.  . CHOLECYSTECTOMY  1976  . ENDOSCOPIC VEIN LASER TREATMENT    . EYE MUSCLE SURGERY Left   . FOOT NEUROMA SURGERY Right   . NM MYOVIEW LTD  02/2012   No evidence of ischemia or infarction. Normal ejection fraction.  . TONSILLECTOMY AND ADENOIDECTOMY  1938  . TOTAL HIP ARTHROPLASTY Left 2000   Left  . TOTAL KNEE ARTHROPLASTY Right 11/17/2012   Procedure: RIGHT TOTAL KNEE ARTHROPLASTY;  Surgeon: [name], MD;  Location: [facility];  Service: Orthopedics;  Laterality: Right;    Current Meds  Medication Sig  . Apoaequorin (PREVAGEN PO) Take by mouth daily.  Marland Kitchen aspirin 81 MG tablet Take 81 mg by mouth daily.  . calcium carbonate (TUMS - DOSED IN MG ELEMENTAL CALCIUM) 500 MG chewable tablet Chew 2 tablets by mouth daily.   Marland Kitchen  CALCIUM-MAGNESIUM PO Take 300 tablets by mouth daily.   . cholecalciferol (VITAMIN D) 1000 UNITS tablet Take 1,000 Units by mouth daily.  Marland Kitchen docusate sodium 100 MG CAPS Take 100 mg by mouth 2 (two) times daily.  Marland Kitchen gabapentin (NEURONTIN) 100 MG capsule TK 1 Cap PO IN THE MORNING AND 3 Caps PO IN THE EVE UTD  . levothyroxine (SYNTHROID, LEVOTHROID) 75 MCG tablet Take 75 mcg by mouth every other day. SUN, Tuesday, Thursday, Sat  . levothyroxine (SYNTHROID, LEVOTHROID) 88 MCG tablet Take 88 mcg by mouth every other day. MON, WED, FRIDAY  . Multiple Vitamins-Minerals (PRESERVISION AREDS 2) CAPS Take 2 capsules by mouth daily.  [name] 0.4-0.3 % SOLN Place 1 drop into both eyes daily as needed (for dry eyes).    Allergies  Allergen Reactions  . Alendronate Sodium   . Codeine   . Lipitor [Atorvastatin]   . Oxycodone Nausea And Vomiting  . Zetia [Ezetimibe]   . Zocor [Simvastatin]     Social History  Social History  . Marital status: Widowed    Spouse name: Winfred  . Number of children: 4  . Years of education: 12   Occupational History  .  [place]ired    [employer]   Social History Main Topics  . Smoking status: Never Smoker  . Smokeless tobacco: Never Used  . Alcohol use No  . Drug use: No  . Sexual activity: Not Asked   Other Topics Concern  . None   Social History Narrative   Patient lives at home alone. She is wheezing a widowed - her husband (Winfred) died less than half a year ago..    High school education.  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   Caffeine- one cup coffee daily.   - She did have prolonged secondhand smoke exposure from her first husband.   One child deceased -> he died of overdose of prescription medications   3 remaining sons aged 81, 7960 and 265. -> Also one stepchild. She has a total of 7 grandchildren of her home with  three-step grandchildren. 10+ great-grandchildren.    family history includes Cancer in her mother; Heart disease in her father; Hyperlipidemia in her brother; Hypertension in her sister.  Wt Readings from Last 3 Encounters:  10/31/16 114 lb (51.7 kg)  06/06/15 120 lb (54.4 kg)  06/15/13 122 lb (55.3 kg)    PHYSICAL EXAM BP (!) 143/77 (BP Location: Left Arm, Patient Position: Sitting, Cuff Size: Normal)   Pulse 71   Ht 5\' 2"  (1.575 m)   Wt 114 lb (51.7 kg)   BMI 20.85 kg/m  General appearance: alert, cooperative, appears stated age, no distRelatively thin, but well groomed.  HEENT: [place]/AT, EOMI, MMM, anicteric sclera Neck: no adenopathy, no carotid bruit and no JVD Lungs: clear to auscultation bilaterally, normal percussion bilaterally and non-labored Heart: regular rate and rhythm, S1 &S2 normal - cannot exclude mild S4 gallop, no murmur, click, rub; nondisplaced PMI Abdomen: soft, non-tender; bowel sounds normal; no masses,  no organomegaly;  no HJR Extremities: extremities normal, atraumatic, no cyanosis, or edema  Pulses: 2+ and symmetric; Skin: mobility and turgor normal, no evidence of bleeding or bruising, no lesions noted, texture normal and Mild varicose veins, but no significant swollen veins. No significant venous stasis changes Neurologic: Mental status: Alert & oriented x 3, thought content appropriate; non-focal exam.  Pleasant mood & affect. Cranial nerves: normal (II-XII grossly intact)    Adult ECG Report  Rate: 72 ;  Rhythm: normal sinus rhythm and Normal axis, intervals and durations;   Narrative Interpretation:  normal   Other studies Reviewed: Additional studies/ records that were reviewed today include:  Recent Labs:  from PCPs note 05/28/2016:   Sodium 135, potassium 5.2, chloride 97, bicarbonate 31, calcium 9.7, BUN/creatinine 9/0.68, glucose 93. AST/ALT/alkaline phosphatase 13/16/73.  Total cholesterol 234, triglycerides 140, HDL 67, LDL 139.  EKG  from 10/10/2011: Sinus rhythm, 83 BPM. Normal axis, intervals and durations.  ASSESSMENT / PLAN: Problem List Items Addressed This Visit    Abnormal heart sounds -S4 Gallop    Plan we'll be checking a 2-D echocardiogram as well as CTA.  Was not originally ordered, we will contact her to get this scheduled.      Relevant Orders   EKG 12-Lead (Completed)   CT CORONARY MORPH W/CTA COR W/SCORE W/CA W/CM &/OR WO/CM   CT CORONARY FRACTIONAL FLOW RESERVE DATA PREP  CT CORONARY FRACTIONAL FLOW RESERVE FLUID ANALYSIS   Chest pain with moderate risk for cardiac etiology    Interesting that she notes exertional dyspnea and is having some chest pressure. This doesn't occur with every time she exerts herself, but it is somewhat concerning given the association with exertional dyspnea. - She is taking baby aspirin for the medications pending results of CTA   Plan: We will evaluate for coronary artery disease with coronary CTA that'll allow [place] to do the possibility of CT FFR evaluation of a particular lesion. - She is artery had a negative Myoview, however in the interest of not missing left main disease, a CT angiogram will be more conclusive.      Relevant Orders   EKG 12-Lead (Completed)   CT CORONARY MORPH W/CTA COR W/SCORE W/CA W/CM &/OR WO/CM   CT CORONARY FRACTIONAL FLOW RESERVE DATA PREP   CT CORONARY FRACTIONAL FLOW RESERVE FLUID ANALYSIS   Essential hypertension (Chronic)    Borderline hypotensive today. She is not on any medications for blood pressure. Continue to monitor for now.      Relevant Orders   EKG 12-Lead (Completed)   CT CORONARY MORPH W/CTA COR W/SCORE W/CA W/CM &/OR WO/CM   CT CORONARY FRACTIONAL FLOW RESERVE DATA PREP   CT CORONARY FRACTIONAL FLOW RESERVE FLUID ANALYSIS   Exercise intolerance    Both related to exertional dyspnea and some intermittent chest discomfort. Concerning for possible ischemic symptom. Plan: Coronary CTA with CT FFR.      Relevant Orders     EKG 12-Lead (Completed)   CT CORONARY MORPH W/CTA COR W/SCORE W/CA W/CM &/OR WO/CM   CT CORONARY FRACTIONAL FLOW RESERVE DATA PREP   CT CORONARY FRACTIONAL FLOW RESERVE FLUID ANALYSIS   Exertional dyspnea - Primary    Is probably the most concerning symptom for her progressively worsening exertional dyspnea which is certainly concerning for ischemia. Previously evaluated with Myoview, we will change to a coronary CT angiogram with the possibility for CT FFR. This allows both for anatomic and physiologic evaluation.  I also plan to check 2-D echocardiogram (not currently ordered, but await results of CTA)      Relevant Orders   EKG 12-Lead (Completed)   CT CORONARY MORPH W/CTA COR W/SCORE W/CA W/CM &/OR WO/CM   CT CORONARY FRACTIONAL FLOW RESERVE DATA PREP   CT CORONARY FRACTIONAL FLOW RESERVE FLUID ANALYSIS      Current medicines are reviewed at length with the patient today. (+/- concerns) n/a The following changes have been made: n/a  Patient Instructions  SCHEDULE AT [address] Your physician has requested that you have cardiac CT. Cardiac computed tomography (CT) is a painless test that uses an x-ray machine to take clear, detailed pictures of your heart. For further information please visit [email or web address] Please follow instruction sheet as given.     .Your physician recommends that you schedule a follow-up appointment in 2 MONTHS WITH DR Alvis Edgell.    Studies Ordered:   Orders Placed This Encounter  Procedures  . CT CORONARY MORPH W/CTA COR W/SCORE W/CA W/CM &/OR WO/CM  . CT CORONARY FRACTIONAL FLOW RESERVE DATA PREP  . CT CORONARY FRACTIONAL FLOW RESERVE FLUID ANALYSIS  . EKG 12-Lead      Bryan Lemma, M.D., M.S. Interventional Cardiologist   Pager # [phone] Phone # [phone] [address] [place], [place] [address]

## 2016-11-01 ENCOUNTER — Encounter: Payer: Self-pay | Admitting: Cardiology

## 2016-11-01 NOTE — Assessment & Plan Note (Signed)
Both related to exertional dyspnea and some intermittent chest discomfort. Concerning for possible ischemic symptom. Plan: Coronary CTA with CT FFR.

## 2016-11-01 NOTE — Assessment & Plan Note (Addendum)
Interesting that she notes exertional dyspnea and is having some chest pressure. This doesn't occur with every time she exerts herself, but it is somewhat concerning given the association with exertional dyspnea. - She is taking baby aspirin for the medications pending results of CTA   Plan: We will evaluate for coronary artery disease with coronary CTA that'll allow us to do the possibility of CT FFR evaluation of a particular lesion. - She is artery had a negative Myoview, however in the interest of not missing left main disease, a CT angiogram will be more conclusive.

## 2016-11-01 NOTE — Assessment & Plan Note (Signed)
Plan we'll be checking a 2-D echocardiogram as well as CTA.  Was not originally ordered, we will contact her to get this scheduled.

## 2016-11-01 NOTE — Assessment & Plan Note (Signed)
Is probably the most concerning symptom for her progressively worsening exertional dyspnea which is certainly concerning for ischemia. Previously evaluated with Myoview, we will change to a coronary CT angiogram with the possibility for CT FFR. This allows both for anatomic and physiologic evaluation.  I also plan to check 2-D echocardiogram (not currently ordered, but await results of CTA)

## 2016-11-01 NOTE — Assessment & Plan Note (Signed)
Borderline hypotensive today. She is not on any medications for blood pressure. Continue to monitor for now.

## 2016-11-05 ENCOUNTER — Encounter: Payer: Self-pay | Admitting: Cardiology

## 2016-11-20 ENCOUNTER — Telehealth: Payer: Self-pay

## 2016-11-20 ENCOUNTER — Encounter (HOSPITAL_COMMUNITY): Payer: Self-pay

## 2016-11-20 ENCOUNTER — Ambulatory Visit (HOSPITAL_COMMUNITY)
Admission: RE | Admit: 2016-11-20 | Discharge: 2016-11-20 | Disposition: A | Discharge status: Home / Self Care | Payer: Medicare Other | Source: Ambulatory Visit | Attending: Cardiology | Admitting: Cardiology

## 2016-11-20 DIAGNOSIS — R6889 Other general symptoms and signs: Secondary | ICD-10-CM | POA: Diagnosis not present

## 2016-11-20 DIAGNOSIS — R0600 Dyspnea, unspecified: Secondary | ICD-10-CM

## 2016-11-20 DIAGNOSIS — R079 Chest pain, unspecified: Secondary | ICD-10-CM | POA: Insufficient documentation

## 2016-11-20 DIAGNOSIS — I2584 Coronary atherosclerosis due to calcified coronary lesion: Secondary | ICD-10-CM | POA: Insufficient documentation

## 2016-11-20 DIAGNOSIS — I10 Essential (primary) hypertension: Secondary | ICD-10-CM

## 2016-11-20 DIAGNOSIS — R012 Other cardiac sounds: Secondary | ICD-10-CM | POA: Diagnosis not present

## 2016-11-20 DIAGNOSIS — R0609 Other forms of dyspnea: Secondary | ICD-10-CM | POA: Diagnosis not present

## 2016-11-20 DIAGNOSIS — I2510 Atherosclerotic heart disease of native coronary artery without angina pectoris: Secondary | ICD-10-CM | POA: Diagnosis not present

## 2016-11-20 LAB — POCT I-STAT CREATININE: Creatinine, Ser: 0.7 mg/dL (ref 0.44–1.00)

## 2016-11-20 MED ORDER — NITROGLYCERIN 0.4 MG SL SUBL
0.8000 mg | SUBLINGUAL_TABLET | Freq: Once | SUBLINGUAL | Status: AC
  Administered 2016-11-20: 0.8 mg via SUBLINGUAL
  Filled 2016-11-20: qty 25

## 2016-11-20 MED ORDER — METOPROLOL TARTRATE 5 MG/5ML IV SOLN
INTRAVENOUS | Status: AC
  Administered 2016-11-20: 5 mg
  Filled 2016-11-20: qty 15

## 2016-11-20 MED ORDER — METOPROLOL TARTRATE 5 MG/5ML IV SOLN
5.0000 mg | INTRAVENOUS | Status: DC | PRN
  Administered 2016-11-20 (×2): 5 mg via INTRAVENOUS

## 2016-11-20 MED ORDER — IOPAMIDOL (ISOVUE-370) INJECTION 76%
INTRAVENOUS | Status: AC
  Administered 2016-11-20: 80 mL
  Filled 2016-11-20: qty 100

## 2016-11-20 MED ORDER — NITROGLYCERIN 0.4 MG SL SUBL
SUBLINGUAL_TABLET | SUBLINGUAL | Status: AC
  Filled 2016-11-20: qty 2

## 2016-11-20 NOTE — Telephone Encounter (Signed)
I don't really know how to write a creatinine order for patient as an outpatient. Perhaps Jasmine DecemberSharon or one of the other nurses can assist.  Bryan Lemmaavid Heaton Sarin, MD

## 2016-11-20 NOTE — Progress Notes (Signed)
Patient tolerated CT scan without incident, PO challenge with coffee and crackers tolerated well.

## 2016-11-20 NOTE — Telephone Encounter (Signed)
Spoke with Tresa EndoKelly in the CT department at [facility] to get a STAT creatinine on patient before her test. Per pre-procedure protocol gave verbal order for creatinine stat for Dr. Elissa HeftyHarding's patient. Will forward to Dr. Herbie BaltimoreHarding so he is aware.

## 2016-11-26 ENCOUNTER — Telehealth: Payer: Self-pay | Admitting: *Deleted

## 2016-11-26 NOTE — Telephone Encounter (Signed)
Left message to call back for results:  Notes recorded by Marykay LexHarding, David W, MD on 11/25/2016 at 6:58 PM EDT Coronary CT angiogram results: No significant incidental noncardiac findings. Potentially a small hepatic cyst noted (11 mm)  Cardiac calcium score was significantly elevated at 367 with calcium noted at the base of the aortic valve as well as left main and proximal LAD. Coronary CTA revealed no significant left main stenosis with only less than 30% calcified LAD stenosis. The remainder of the vessels in the left dominant system were relatively free of disease.  This indicates that there is evidence of coronary artery disease, but not obstructed. To explain chest discomfort or dyspnea.  Bryan Lemmaavid Harding, MD

## 2016-12-10 ENCOUNTER — Encounter: Payer: Self-pay | Admitting: *Deleted

## 2017-01-03 ENCOUNTER — Ambulatory Visit (INDEPENDENT_AMBULATORY_CARE_PROVIDER_SITE_OTHER): Payer: Medicare Other | Admitting: Cardiology

## 2017-01-03 ENCOUNTER — Encounter: Payer: Self-pay | Admitting: Cardiology

## 2017-01-03 VITALS — BP 164/80 | HR 78 | Ht 62.0 in | Wt 115.6 lb

## 2017-01-03 DIAGNOSIS — R079 Chest pain, unspecified: Secondary | ICD-10-CM

## 2017-01-03 DIAGNOSIS — R0609 Other forms of dyspnea: Secondary | ICD-10-CM

## 2017-01-03 DIAGNOSIS — R6889 Other general symptoms and signs: Secondary | ICD-10-CM

## 2017-01-03 DIAGNOSIS — R012 Other cardiac sounds: Secondary | ICD-10-CM

## 2017-01-03 DIAGNOSIS — R0600 Dyspnea, unspecified: Secondary | ICD-10-CM

## 2017-01-03 DIAGNOSIS — I10 Essential (primary) hypertension: Secondary | ICD-10-CM | POA: Diagnosis not present

## 2017-01-03 NOTE — Patient Instructions (Signed)
SCHEDULE AT [address] Your physician has requested that you have an echocardiogram. Echocardiography is a painless test that uses sound waves to create images of your heart. It provides your doctor with information about the size and shape of your heart and how well your heart's chambers and valves are working. This procedure takes approximately one hour. There are no restrictions for this procedure.     Your physician recommends that you schedule a follow-up appointment in  3 MONTHS  WITH DR HARDING.--IF ABNORMAL

## 2017-01-03 NOTE — Progress Notes (Signed)
PCP: Tally Joe, MD  Clinic Note: Chief Complaint  Patient presents with  . Follow-up    exertional dyspnea    HPI: Summer Davis is a 83 y.o. female who is being seen today for follow-up evaluation for shortness of breath at the request of Tally Joe, MD & Deatra James, MD  Jolyne Loa was initially seen on 10/31/2016 - he was noting pretty significant exert Very limited by her exertional dyspnea.  We ordered a coronary CTA, and the intention was to order an echocardiogram which was not actually ordered.  Recent Hospitalizations: none  Studies Personally Reviewed - (if available, images/films reviewed: From Epic Chart or Care Everywhere)  Coronary calcium score/coronary CTA No significant "non-cardiac" findings  Coronary Calcium Score (HIGH) 367.  < 30% calcified pLAD. Calcification at base of AoV near the LM.  Interval History: Summer Davis presents here today after having had her coronary calcium score/coronary CTA done.  He still notes that her exertional d, but really has not had any chest discomfort.  Right now she is trying to recover from a, and is no longer noticing any fevers or chills.  She denies any real PND, orthopnea Or edema. Minimal palpitations.  No lightheadedness, dizziness, weakness or syncope/near syncope. No TIA/amaurosis fugax symptoms.  No claudication.  ROS: A comprehensive was performed.ertinent symptoms noted above Review of Systems  Constitutional: Negative for malaise/fatigue.  HENT: Positive for congestion. Negative for nosebleeds.   Respiratory: Positive for cough (etting over bronchitis) and shortness of breath (improving).        Trying to get over a bout of bronchitis and coughing and wheezing. Still quite short of breath. No further fevers or chills.  Gastrointestinal: Negative for blood in stool and melena.  Musculoskeletal: Positive for back pain and joint pain (knees and ankles).  Psychiatric/Behavioral: Positive for memory  loss.       Till doing well following the loss of her husband  All other systems reviewed and are negative.  I have reviewed and (if needed) personally updated the patient's problem list, medications, allergies, past medical and surgical history, social and family history.   Past Medical History:  Diagnosis Date  . Arthritis   . DOE (dyspnea on exertion)    Evaluated in 2013 with a Myoview that was negative for ischemia  . Esophageal reflux   . GERD (gastroesophageal reflux disease)   . H/O scoliosis   . Hypercholesterolemia   . Hypothyroidism   . Insomnia   . Lichen sclerosus   . Memory difficulty   . PONV (postoperative nausea and vomiting)   . Tortuous colon   . UTI (lower urinary tract infection)   . Varicose veins of both lower extremities with pain    h/x varicose veins - with endoscopic vein ablation    Past Surgical History:  Procedure Laterality Date  . CATARACT EXTRACTION Bilateral 2016   Left-sided surgery was unsuccessful.  . CHOLECYSTECTOMY  1976  . ENDOSCOPIC VEIN LASER TREATMENT    . EYE MUSCLE SURGERY Left   . FOOT NEUROMA SURGERY Right   . NM MYOVIEW LTD  02/2012   No evidence of ischemia or infarction. Normal ejection fraction.  . TONSILLECTOMY AND ADENOIDECTOMY  1938  . TOTAL HIP ARTHROPLASTY Left 2000   Left  . TOTAL KNEE ARTHROPLASTY Right 11/17/2012   Procedure: RIGHT TOTAL KNEE ARTHROPLASTY;  Surgeon: [name], MD;  Location: [facility];  Service: Orthopedics;  Laterality: Right;    Current Meds  Medication Sig  .  Apoaequorin (PREVAGEN PO) Take by mouth daily.  Marland Kitchen aspirin 81 MG tablet Take 81 mg by mouth daily.  . calcium carbonate (TUMS - DOSED IN MG ELEMENTAL CALCIUM) 500 MG chewable tablet Chew 2 tablets by mouth daily.   Marland Kitchen CALCIUM-MAGNESIUM PO Take 300 tablets by mouth daily.   . cholecalciferol (VITAMIN D) 1000 UNITS tablet Take 1,000 Units by mouth daily.  Marland Kitchen docusate sodium 100 MG CAPS Take 100 mg by mouth 2 (two) times daily.  Marland Kitchen  gabapentin (NEURONTIN) 100 MG capsule TK 1 Cap PO IN THE MORNING AND 3 Caps PO IN THE EVE UTD  . levothyroxine (SYNTHROID, LEVOTHROID) 75 MCG tablet Take 75 mcg by mouth every other day. SUN, Tuesday, Thursday, Sat  . levothyroxine (SYNTHROID, LEVOTHROID) 88 MCG tablet Take 88 mcg by mouth every other day. MON, WED, FRIDAY  . Multiple Vitamins-Minerals (PRESERVISION AREDS 2) CAPS Take 2 capsules by mouth daily.  [name] 0.4-0.3 % SOLN Place 1 drop into both eyes daily as needed (for dry eyes).    Allergies  Allergen Reactions  . Alendronate Sodium   . Codeine   . Lipitor [Atorvastatin]   . Oxycodone Nausea And Vomiting  . Zetia [Ezetimibe]   . Zocor [Simvastatin]     Social History   Social History  . Marital status: Widowed    Spouse name: Winfred  . Number of children: 4  . Years of education: 12   Occupational History  .  Retired    [employer]   Social History Main Topics  . Smoking status: Never Smoker  . Smokeless tobacco: Never Used  . Alcohol use No  . Drug use: No  . Sexual activity: Not Asked   Other Topics Concern  . None   Social History Narrative   Patient lives at home alone. She is wheezing a widowed - her husband (Winfred) died less than half a year ago..    High school education.  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   Caffeine- one cup coffee daily.   - She did have prolonged secondhand smoke exposure from her first husband.   One child deceased -> he died of overdose of prescription medications   3 remaining sons aged 12, 71 and 19. -> Also one stepchild. She has a total of 7 grandchildren of her home with three-step grandchildren. 10+ great-grandchildren.    family history includes Cancer in her mother; Heart disease in her father; Hyperlipidemia in her brother; Hypertension in her sister.  Wt Readings from Last 3  Encounters:  01/03/17 115 lb 9.6 oz (52.4 kg)  10/31/16 114 lb (51.7 kg)  06/06/15 120 lb (54.4 kg)    PHYSICAL EXAM BP (!) 164/80   Pulse 78   Ht 5\' 2"  (1.575 m)   Wt 115 lb 9.6 oz (52.4 kg)   SpO2 99%   BMI 21.14 kg/m  Physical Exam  Constitutional: She is oriented to person, place, and time. She appears well-developed and well-nourished. No distress.  Well-groomed  HENT:  Head: Normocephalic and atraumatic.  Neck: No hepatojugular reflux and no JVD present. Carotid bruit is not present.  Cardiovascular: Normal rate, regular rhythm and intact distal pulses.  Exam reveals gallop (S4). Exam reveals no friction rub.   No murmur heard. Pulmonary/Chest: Effort normal and breath sounds normal. No respiratory distress. She has no wheezes. She has no rales.  Abdominal: Soft. Bowel  sounds are normal. She exhibits no distension. There is no tenderness. There is no rebound and no guarding.  No HJR  Musculoskeletal: Normal range of motion. She exhibits no edema.  Neurological: She is alert and oriented to person, place, and time.  Psychiatric: She has a normal mood and affect. Her behavior is normal. Judgment and thought content normal.  Nursing note and vitals reviewed.    Adult ECG Report  n/a  Other studies Reviewed: Additional studies/ records that were reviewed today include:  Recent Labs:  No results found for: CHOL, HDL, LDLCALC, LDLDIRECT, TRIG, CHOLHDL Lab Results  Component Value Date   CREATININE 0.70 11/20/2016   BUN 8 11/19/2012   NA 134 (L) 11/19/2012   K 4.4 11/19/2012   CL 101 11/19/2012   CO2 26 11/19/2012    ASSESSMENT / PLAN: Problem List Items Addressed This Visit    Abnormal heart sounds -S4 Gallop    Plan: Check Echo to evaluate for HTN Heart Disease.      Chest pain with moderate risk for cardiac etiology    With essentially non-occlusive findings on Cor CTA, unlikely that exertional dyspnea is ischemia mediated. Still cannot exclude Hypertensive  Heart Disease mediated discomfort.  - 2 D Echo      Essential hypertension (Chronic)    Blood pressure was not that high on her Is not currently on any blood pressure for signs of hypertensive heart disease (diastolic dysfunction, LVH, Left Atrial Enlargment etc) Would then treat blood pressure according to findings      Exercise intolerance    Non-ischemic findings of Coronary CTA - would suggest either HTN Heart Disease (HFpEF) vs. Non-cardiac etiology.  Plan 2 DEcho & consider CPX if she would be interested. Would prefer to avoid invasive procedures unless there is compelling evidence of ischemic CAD.      Exertional dyspnea - Primary    Normal Cor CT - check 2 D Echo to further r/o potential cardiac cause.      Relevant Orders   ECHOCARDIOGRAM COMPLETE      Current medicines are reviewed at length with the patient today. (+/- concerns) n/a The following changes have been made: n/a  Patient Instructions  SCHEDULE AT [address] Your physician has requested that you have an echocardiogram. Echocardiography is a painless test that uses sound waves to create images of your heart. It provides your doctor with information about the size and shape of your heart and how well your heart's chambers and valves are working. This procedure takes approximately one hour. There are no restrictions for this procedure.     Your physician recommends that you schedule a follow-up appointment in  3 MONTHS  WITH DR Amaris Garrette.--IF ABNORMAL   Studies Ordered:   Orders Placed This Encounter  Procedures  . ECHOCARDIOGRAM COMPLETE      Bryan Lemma, M.D., M.S. Interventional Cardiologist   Pager # [phone] Phone # [phone] [address] [place], [place] [address]

## 2017-01-06 ENCOUNTER — Encounter: Payer: Self-pay | Admitting: Cardiology

## 2017-01-06 NOTE — Assessment & Plan Note (Signed)
Plan: Check Echo to evaluate for HTN Heart Disease.

## 2017-01-06 NOTE — Assessment & Plan Note (Signed)
With essentially non-occlusive findings on Cor CTA, unlikely that exertional dyspnea is ischemia mediated. Still cannot exclude Hypertensive Heart Disease mediated discomfort.  - 2 D Echo

## 2017-01-06 NOTE — Assessment & Plan Note (Signed)
Normal Cor CT - check 2 D Echo to further r/o potential cardiac cause.

## 2017-01-06 NOTE — Assessment & Plan Note (Signed)
Non-ischemic findings of Coronary CTA - would suggest either HTN Heart Disease (HFpEF) vs. Non-cardiac etiology.  Plan 2 DEcho & consider CPX if she would be interested. Would prefer to avoid invasive procedures unless there is compelling evidence of ischemic CAD.

## 2017-01-06 NOTE — Assessment & Plan Note (Signed)
Blood pressure was not that high on her Is not currently on any blood pressure for signs of hypertensive heart disease (diastolic dysfunction, LVH, Left Atrial Enlargment etc) Would then treat blood pressure according to findings

## 2017-01-14 ENCOUNTER — Other Ambulatory Visit: Payer: Self-pay

## 2017-01-14 ENCOUNTER — Ambulatory Visit (HOSPITAL_COMMUNITY): Payer: Medicare Other | Attending: Cardiology

## 2017-01-14 DIAGNOSIS — R0600 Dyspnea, unspecified: Secondary | ICD-10-CM

## 2017-01-14 DIAGNOSIS — I350 Nonrheumatic aortic (valve) stenosis: Secondary | ICD-10-CM | POA: Diagnosis not present

## 2017-01-14 DIAGNOSIS — I351 Nonrheumatic aortic (valve) insufficiency: Secondary | ICD-10-CM | POA: Insufficient documentation

## 2017-01-14 DIAGNOSIS — R0609 Other forms of dyspnea: Secondary | ICD-10-CM | POA: Diagnosis present

## 2017-01-14 DIAGNOSIS — E785 Hyperlipidemia, unspecified: Secondary | ICD-10-CM | POA: Insufficient documentation

## 2017-01-14 DIAGNOSIS — Z8249 Family history of ischemic heart disease and other diseases of the circulatory system: Secondary | ICD-10-CM | POA: Insufficient documentation

## 2017-01-14 HISTORY — PX: TRANSTHORACIC ECHOCARDIOGRAM: SHX275

## 2017-01-17 ENCOUNTER — Telehealth: Payer: Self-pay | Admitting: *Deleted

## 2017-01-17 NOTE — Telephone Encounter (Signed)
-----   Message from Marykay Lexavid W Harding, MD sent at 01/14/2017  6:28 PM EDT ----- Peri JeffersonGood news: Essentially normal echocardiogram and normal pump function and relaxation is normal for age. There is some calcification on the aortic valve, but no obstruction of outflow. Mild back leaking, but nothing that we need to follow.   Bryan Lemmaavid Harding, MD

## 2017-01-17 NOTE — Telephone Encounter (Signed)
Left detailed result message on  Secure voice mail per dpi, any question may call back

## 2017-01-27 NOTE — Telephone Encounter (Signed)
Summer Davis is calling to get her Echo results . Please call

## 2017-01-27 NOTE — Telephone Encounter (Signed)
Spoke to patient. Result given . Verbalized understanding  

## 2017-02-28 ENCOUNTER — Ambulatory Visit (INDEPENDENT_AMBULATORY_CARE_PROVIDER_SITE_OTHER): Payer: Medicare Other | Admitting: Internal Medicine

## 2017-02-28 ENCOUNTER — Encounter: Payer: Self-pay | Admitting: Internal Medicine

## 2017-02-28 ENCOUNTER — Other Ambulatory Visit (INDEPENDENT_AMBULATORY_CARE_PROVIDER_SITE_OTHER): Payer: Medicare Other

## 2017-02-28 ENCOUNTER — Telehealth: Payer: Self-pay | Admitting: Internal Medicine

## 2017-02-28 VITALS — BP 114/62 | HR 80 | Ht 61.5 in | Wt 114.0 lb

## 2017-02-28 DIAGNOSIS — R0609 Other forms of dyspnea: Secondary | ICD-10-CM

## 2017-02-28 DIAGNOSIS — R0600 Dyspnea, unspecified: Secondary | ICD-10-CM

## 2017-02-28 LAB — CBC WITH DIFFERENTIAL/PLATELET
Basophils Absolute: 0 10*3/uL (ref 0.0–0.1)
Basophils Relative: 0.3 % (ref 0.0–3.0)
Eosinophils Absolute: 0.1 10*3/uL (ref 0.0–0.7)
Eosinophils Relative: 1 % (ref 0.0–5.0)
HCT: 44.2 % (ref 36.0–46.0)
Hemoglobin: 14.7 g/dL (ref 12.0–15.0)
Lymphocytes Relative: 23.2 % (ref 12.0–46.0)
Lymphs Abs: 1.4 10*3/uL (ref 0.7–4.0)
MCHC: 33.2 g/dL (ref 30.0–36.0)
MCV: 86.3 fl (ref 78.0–100.0)
Monocytes Absolute: 0.6 10*3/uL (ref 0.1–1.0)
Monocytes Relative: 10.1 % (ref 3.0–12.0)
Neutro Abs: 4 10*3/uL (ref 1.4–7.7)
Neutrophils Relative %: 65.4 % (ref 43.0–77.0)
Platelets: 274 10*3/uL (ref 150.0–400.0)
RBC: 5.13 Mil/uL — ABNORMAL HIGH (ref 3.87–5.11)
RDW: 14.2 % (ref 11.5–15.5)
WBC: 6.1 10*3/uL (ref 4.0–10.5)

## 2017-02-28 LAB — BASIC METABOLIC PANEL
BUN: 12 mg/dL (ref 6–23)
CO2: 28 mEq/L (ref 19–32)
Calcium: 10 mg/dL (ref 8.4–10.5)
Chloride: 99 mEq/L (ref 96–112)
Creatinine, Ser: 0.71 mg/dL (ref 0.40–1.20)
GFR: 83.3 mL/min (ref >60.00)
Glucose, Bld: 104 mg/dL — ABNORMAL HIGH (ref 70–99)
Potassium: 4.8 mEq/L (ref 3.5–5.1)
Sodium: 137 mEq/L (ref 135–145)

## 2017-02-28 LAB — BRAIN NATRIURETIC PEPTIDE: Pro B Natriuretic peptide (BNP): 66 pg/mL (ref 0.0–100.0)

## 2017-02-28 LAB — D-DIMER, QUANTITATIVE: D-Dimer, Quant: 1.31 mcg/mL FEU — ABNORMAL HIGH (ref <0.50)

## 2017-02-28 LAB — TSH: TSH: 1.97 u[IU]/mL (ref 0.35–4.50)

## 2017-02-28 MED ORDER — PANTOPRAZOLE SODIUM 40 MG PO TBEC: 40.0000 mg | DELAYED_RELEASE_TABLET | Freq: Every day | ORAL | 2 refills | Status: DC

## 2017-02-28 MED ORDER — FAMOTIDINE 20 MG PO TABS: ORAL_TABLET | ORAL | 2 refills | Status: DC

## 2017-02-28 NOTE — Progress Notes (Signed)
Subjective:     Patient ID: Summer Davis, female   DOB: 22-Feb-1931,     MRN: [id number]  HPI   [age] yowf never smoker with onset of unexplained sob w/in a month of husband's death in Dec 2018 referred to pulmonary clinic 02/28/2017 by Dr  Summer Davis   02/28/2017 1st [facility] office visit/ Summer Davis   Chief Complaint  Patient presents with  . Pulmonary Consult    Referred by Dr. Tally Joeavid Davis. Pt c/o DOE x 6 months- any outdoor activity, walking up an incline or cleaning her home makes her feel SOB.    onset of doe walking up to neighbors house first noted  in Jan 2018 - same route had always done before because [facility] -  and happens every time and makes it to  house and sits down and recovers w/in 5 min  / worse when carries   Anything more than 5 lbs/ no assoc chest pain, nausea or diaphoresis, same pattern in all weather conditions.  Has assoc dysphagia but not overt hb about the same period time and does carry dx of gerd  No obvious day to day or daytime variability or assoc excess/ purulent sputum or mucus plugs or hemoptysis or cp or chest tightness, subjective wheeze or overt sinus or hb symptoms. No unusual exp hx or h/o childhood pna/ asthma or knowledge of premature birth.  Sleeping ok flat without nocturnal  or early am exacerbation  of respiratory  c/o's or need for noct saba. Also denies any obvious fluctuation of symptoms with weather or environmental changes or other aggravating or alleviating factors except as outlined above   Current Allergies, Complete Past Medical History, Past Surgical History, Family History, and Social History were reviewed in [facility] electronic medical record.  ROS  The following are not active complaints unless bolded Hoarseness, sore throat, dysphagia, dental problems, itching, sneezing,  nasal congestion or discharge of excess mucus or purulent secretions, ear ache,   fever, chills, sweats, unintended wt loss or wt gain,  classically pleuritic or exertional cp,  orthopnea pnd or leg swelling, presyncope, palpitations, abdominal pain, anorexia, nausea, vomiting, diarrhea  or change in bowel habits or change in bladder habits, change in stools or change in urine, dysuria, hematuria,  rash, arthralgias, visual complaints, headache, numbness, weakness or ataxia or problems with walking or coordination,  change in mood/affect or memory.        Current Meds  Medication Sig  . Apoaequorin (PREVAGEN PO) Take by mouth daily.  Marland Kitchen. aspirin 81 MG tablet Take 81 mg by mouth daily.  Marland Kitchen. gabapentin (NEURONTIN) 100 MG capsule TK 1 Cap PO IN THE MORNING AND 3 Caps PO IN THE EVE UTD  . levothyroxine (SYNTHROID, LEVOTHROID) 75 MCG tablet Take 75 mcg by mouth every other day. SUN, Tuesday, Thursday, Sat  . levothyroxine (SYNTHROID, LEVOTHROID) 88 MCG tablet Take 88 mcg by mouth every other day. MON, WED, FRIDAY  . Multiple Vitamins-Minerals (MULTIVITAMIN WITH MINERALS) tablet Take 1 tablet by mouth daily.  . Multiple Vitamins-Minerals (PRESERVISION AREDS 2) CAPS Take 2 capsules by mouth daily.  Bertram Gala. Polyethyl Glycol-Propyl Glycol (SYSTANE) 0.4-0.3 % SOLN Place 1 drop into both eyes daily as needed (for dry eyes).  . [  calcium carbonate (TUMS - DOSED IN MG ELEMENTAL CALCIUM) 500 MG chewable tablet Chew 2 tablets by mouth daily as needed.   .   cholecalciferol (VITAMIN D) 1000 UNITS tablet Take 1,000 Units by mouth daily.  Review of Systems     Objective:   Physical Exam Thin amb wf nad  Wt Readings from Last 3 Encounters:  02/28/17 114 lb (51.7 kg)  01/03/17 115 lb 9.6 oz (52.4 kg)  10/31/16 114 lb (51.7 kg)    Vital signs reviewed  - Note on arrival 02 sats  96% on RA     HEENT: nl dentition, turbinates bilaterally, and oropharynx. Nl external ear canals without cough reflex   NECK :  without JVD/Nodes/TM/ nl carotid upstrokes bilaterally   LUNGS: no acc muscle use,  Nl contour chest which is clear to A and P  bilaterally without cough on insp or exp maneuvers   CV:  RRR  no s3 or murmur or increase in P2, and no edema   ABD:  soft and nontender with nl inspiratory excursion in the supine position. No bruits or organomegaly appreciated, bowel sounds nl  MS:  Nl gait/ ext warm without deformities, calf tenderness, cyanosis or clubbing No obvious joint restrictions   SKIN: warm and dry without lesions    NEURO:  alert, approp, nl sensorium with  no motor or cerebellar deficits apparent.     I personally reviewed images and agree with radiology impression as follows:   Chest CT a coronary study Within the visualized portions of the thorax there are no suspicious appearing pulmonary nodules or masses, there is no acute consolidative airspace disease, no pleural effusions, no pneumothorax and no lymphadenopathy   Labs ordered/ reviewed:      Chemistry      Component Value Date/Time   NA 137 02/28/2017 1118   K 4.8 02/28/2017 1118   CL 99 02/28/2017 1118   CO2 28 02/28/2017 1118   BUN 12 02/28/2017 1118   CREATININE 0.71 02/28/2017 1118      Component Value Date/Time   CALCIUM 10.0 02/28/2017 1118        Lab Results  Component Value Date   WBC 6.1 02/28/2017   HGB 14.7 02/28/2017   HCT 44.2 02/28/2017   MCV 86.3 02/28/2017   PLT 274.0 02/28/2017     Lab Results  Component Value Date   DDIMER 1.31 (H) 02/28/2017      Lab Results  Component Value Date   TSH 1.97 02/28/2017     Lab Results  Component Value Date   PROBNP 66.0 02/28/2017             Assessment:

## 2017-02-28 NOTE — Patient Instructions (Signed)
Stop tums  Pantoprazole (protonix) 40 mg   Take  30-60 min before first meal of the day and Pepcid (famotidine)  20 mg one @  bedtime until return to office - this is the best way to tell whether stomach acid is contributing to your problem.    GERD (REFLUX)  is an extremely common cause of respiratory symptoms just like yours , many times with no obvious heartburn at all.    It can be treated with medication, but also with lifestyle changes including elevation of the head of your bed (ideally with 6 inch  bed blocks),  Smoking cessation, avoidance of late meals, excessive alcohol, and avoid fatty foods, chocolate, peppermint, colas, red wine, and acidic juices such as orange juice.  NO MINT OR MENTHOL PRODUCTS SO NO COUGH DROPS   USE SUGARLESS CANDY INSTEAD (Jolley ranchers or Stover's or Life Savers) or even ice chips will also do - the key is to swallow to prevent all throat clearing. NO OIL BASED VITAMINS - use powdered substitutes.    Please remember to go to the lab department downstairs in the basement  for your tests - we will call you with the results when they are available.       Please schedule a follow up office visit in 4 weeks, sooner if needed  with all medications /inhalers/ solutions in hand so we can verify exactly what you are taking. This includes all medications from all doctors and over the counters

## 2017-02-28 NOTE — Telephone Encounter (Signed)
Spoke with pt, she stated MW wanted to know when her last chest x ray was done. She did not say CT scan. FYI MW.

## 2017-03-02 NOTE — Assessment & Plan Note (Signed)
02/28/2017  Walked RA x 3 laps @ 185 ft each stopped due to  End of study, fast pace, min sob no desat     Symptoms are markedly disproportionate to objective findings and not clear this is actually much of a  lung problem but pt does appear to have difficult to sort out respiratory symptoms of unknown origin for which  DDX  = almost all start with A and  include Adherence, Ace Inhibitors, Acid Reflux, Active Sinus Disease, Alpha 1 Antitripsin deficiency, Anxiety masquerading as Airways dz,  ABPA,  Allergy(esp in young), Aspiration (esp in elderly), Adverse effects of meds,  Active smokers, A bunch of PE's (a small clot burden can't cause this syndrome unless there is already severe underlying pulm or vascular dz with poor reserve) plus two Bs  = Bronchiectasis and Beta blocker use..and one C= CHF     Adherence is always the initial "prime suspect" and is a multilayered concern that requires a "trust but verify" approach in every patient - starting with knowing how to use medications, especially inhalers, correctly, keeping up with refills and understanding the fundamental difference between maintenance and prns vs those medications only taken for a very short course and then stopped and not refilled.  - return with all meds in hand using a trust but verify approach to confirm accurate Medication  Reconciliation The principal here is that until we are certain that the  patients are doing what we've asked, it makes no sense to ask them to do more.   ? Acid (or non-acid) GERD > always difficult to exclude as up to 75% of pts in some series report no assoc GI/ Heartburn symptoms and she has h/o gerd by really not following diet or using ppi optimally > rec max (24h)  acid suppression and diet restrictions/ reviewed and instructions given in writing.    ? Allergy/asthma > no h/o atopy, variability or cough so very unlikely   ? Anxiety/ masked depression > usually at the bottom of this list of usual suspects  but should be much higher on this pt's based on H and P - note onset was w/in a month of husband's death > Follow up per Primary Care planned    ?  A bunch of PE's >  Very unlikely clinically but d dimer is in the intermediate range - will discuss with radiology in view of fact she had CTa during the same time period and perhaps do v/q instead to be complete  ? chf > excluded with bnp so low, neg w/u for ischemia in last 3 m per cards   Total time devoted to counseling  > 50 % of initial 60 min office visit:  review case with pt/ discussion of options/alternatives/ personally creating written customized instructions  in presence of pt  then going over those specific  Instructions directly with the pt including how to use all of the meds but in particular covering each new medication in detail and the difference between the maintenance= "automatic" meds and the prns using an action plan format for the latter (If this problem/symptom => do that organization reading Left to right).  Please see AVS from this visit for a full list of these instructions which I personally wrote for this pt and  are unique to this visit.

## 2017-03-04 NOTE — Progress Notes (Signed)
Spoke with pt and notified of results per Dr. Sherene SiresWert. Pt verbalized understanding and denied any questions. She states she wants to just discuss this more at pending ov in Nov 2018. She is breathing better since she was last seen here, but admits her breathing is not 100% better yet. Will forward to MW to let him know.

## 2017-03-28 ENCOUNTER — Encounter: Payer: Self-pay | Admitting: Internal Medicine

## 2017-03-28 ENCOUNTER — Ambulatory Visit (INDEPENDENT_AMBULATORY_CARE_PROVIDER_SITE_OTHER): Payer: Medicare Other | Admitting: Internal Medicine

## 2017-03-28 VITALS — BP 124/66 | HR 80 | Ht 62.5 in | Wt 116.4 lb

## 2017-03-28 DIAGNOSIS — R05 Cough: Secondary | ICD-10-CM

## 2017-03-28 DIAGNOSIS — R058 Other specified cough: Secondary | ICD-10-CM | POA: Insufficient documentation

## 2017-03-28 DIAGNOSIS — R0609 Other forms of dyspnea: Secondary | ICD-10-CM

## 2017-03-28 DIAGNOSIS — R0600 Dyspnea, unspecified: Secondary | ICD-10-CM

## 2017-03-28 NOTE — Patient Instructions (Addendum)
Stay on protonix and pepcid and the diet and be sure your calcium is the gluconate not the carbonate  Please see patient coordinator before you leave today  to schedule CPST after the holidays and I will call you with the results  If swallowing problem does not improve you will need a need GI eval  Per your PCP or by our group upstairs as you have documented reflux that needs follow up  but ok wait until after the holidays   Pulmonary follow up is as needed

## 2017-03-28 NOTE — Progress Notes (Signed)
Subjective:     Patient ID: Summer Davis, female   DOB: 13-Jun-1930,     MRN: [id number]     Brief patient profile:  [id number] yowf never smoker with onset of unexplained sob w/in a month of husband's death in Dec 2017 referred to pulmonary clinic 02/28/2017 by Summer Davis     History of Present Illness  02/28/2017 1st [facility] office visit/ Summer Davis   Chief Complaint  Patient presents with  . Pulmonary Consult    Referred by Summer. Tally Joeavid Davis. Pt c/o DOE x 6 months- any outdoor activity, walking up an incline or cleaning her home makes her feel SOB.    onset of doe walking up to neighbors house first noted  in Jan 2018 - same route had always done before because her neighbor [facility] -  and happens every time and makes it to  house and sits down and recovers w/in 5 min  / worse when carries   Anything more than 5 lbs/ no assoc chest pain, nausea or diaphoresis, same pattern in all weather conditions. Has assoc dysphagia but not overt hb about the same period time and does carry dx of gerd rec Stop tums Pantoprazole (protonix) 40 mg   Take  30-60 min before first meal of the day and Pepcid (famotidine)  20 mg one @  bedtime until return to office - this is the best way to tell whether stomach acid is contributing to your problem.   GERD   Please remember to go to the lab department downstairs in the basement  for your tests - we will call you with the results when they are available.    Please schedule a follow up office visit in 4 weeks, sooner if needed  with all medications /inhalers/ solutions in hand so we can verify exactly what you are taking. This includes all medications from all doctors and over the counters   03/28/2017  f/u ov/Summer Davis re:  Chief Complaint  Patient presents with  . Follow-up    Breathing is "a little bit better" No new co's today.   still has occ dyshagia/ takes vits with applesauce / using calcium carbonate but otherwise following appears to be following all  instructions  No obvious day to day or daytime variability or assoc excess/ purulent sputum or mucus plugs or hemoptysis or cp or chest tightness, subjective wheeze or overt sinus or hb symptoms. No unusual exp hx or h/o childhood pna/ asthma or knowledge of premature birth.  Sleeping ok flat without nocturnal  or early am exacerbation  of respiratory  c/o's or need for noct saba. Also denies any obvious fluctuation of symptoms with weather or environmental changes or other aggravating or alleviating factors except as outlined above   Current Allergies, Complete Past Medical History, Past Surgical History, Family History, and Social History were reviewed in [facility] electronic medical record.  ROS  The following are not active complaints unless bolded Hoarseness, sore throat, dysphagia, dental problems, itching, sneezing,  nasal congestion or discharge of excess mucus or purulent secretions, ear ache,   fever, chills, sweats, unintended wt loss or wt gain, classically pleuritic or exertional cp,  orthopnea pnd or leg swelling, presyncope, palpitations, abdominal pain, anorexia, nausea, vomiting, diarrhea  or change in bowel habits or change in bladder habits, change in stools or change in urine, dysuria, hematuria,  rash, arthralgias, visual complaints, headache, numbness, weakness or ataxia or problems with walking or coordination,  change in mood/affect or  memory.        Current Meds  Medication Sig  . Apoaequorin (PREVAGEN PO) Take by mouth daily.  Marland Kitchen aspirin 81 MG tablet Take 81 mg by mouth daily.  . Biotin 10 MG TABS Take 1 tablet daily by mouth.  . Calcium Carb-Cholecalciferol (CALCIUM 600+D) 600-800 MG-UNIT TABS Take 1 tablet daily by mouth.  . famotidine (PEPCID) 20 MG tablet One at bedtime  . gabapentin (NEURONTIN) 100 MG capsule TK 1 Cap PO IN THE MORNING AND 3 Caps PO IN THE EVE UTD  . levothyroxine (SYNTHROID, LEVOTHROID) 75 MCG tablet Take 75 mcg by mouth every other day. SUN,  Tuesday, Thursday, Sat  . levothyroxine (SYNTHROID, LEVOTHROID) 88 MCG tablet Take 88 mcg by mouth every other day. MON, WED, FRIDAY  . Multiple Vitamins-Minerals (MULTIVITAMIN WITH MINERALS) tablet Take 1 tablet by mouth daily.  . Multiple Vitamins-Minerals (PRESERVISION AREDS 2) CAPS Take 2 capsules by mouth daily.  . pantoprazole (PROTONIX) 40 MG tablet Take 1 tablet (40 mg total) by mouth daily. Take 30-60 min before first meal of the day  . Polyethyl Glycol-Propyl Glycol (SYSTANE) 0.4-0.3 % SOLN Place 1 drop into both eyes daily as needed (for dry eyes).  Marland Kitchen zolpidem (AMBIEN CR) 6.25 MG CR tablet Take 6.25 mg at bedtime as needed by mouth for sleep.               Objective:   Physical Exam  Thin amb wf nad    03/28/2017      116  02/28/17 114 lb (51.7 kg)  01/03/17 115 lb 9.6 oz (52.4 kg)  10/31/16 114 lb (51.7 kg)    Vital signs reviewed  - Note on arrival 02 sats  98% on RA      HEENT: nl dentition, turbinates bilaterally, and oropharynx. Nl external ear canals without cough reflex   NECK :  without JVD/Nodes/TM/ nl carotid upstrokes bilaterally   LUNGS: no acc muscle use,  Nl contour chest which is clear to A and P bilaterally without cough on insp or exp maneuvers   CV:  RRR  no s3 or murmur or increase in P2, and no edema   ABD:  soft and nontender with nl inspiratory excursion in the supine position. No bruits or organomegaly appreciated, bowel sounds nl  MS:  Nl gait/ ext warm without deformities, calf tenderness, cyanosis or clubbing No obvious joint restrictions   SKIN: warm and dry without lesions    NEURO:  alert, approp, nl sensorium with  no motor or cerebellar deficits apparent.       Labs  reviewed:      Chemistry      Component Value Date/Time   NA 137 02/28/2017 1118   K 4.8 02/28/2017 1118   CL 99 02/28/2017 1118   CO2 28 02/28/2017 1118   BUN 12 02/28/2017 1118   CREATININE 0.71 02/28/2017 1118      Component Value Date/Time    CALCIUM 10.0 02/28/2017 1118        Lab Results  Component Value Date   WBC 6.1 02/28/2017   HGB 14.7 02/28/2017   HCT 44.2 02/28/2017   MCV 86.3 02/28/2017   PLT 274.0 02/28/2017     Lab Results  Component Value Date   DDIMER 1.31 (H) 02/28/2017        (refused CTa  03/04/17)    Lab Results  Component Value Date   TSH 1.97 02/28/2017     Lab Results  Component Value Date  PROBNP 66.0 02/28/2017             Assessment:

## 2017-03-31 ENCOUNTER — Encounter: Payer: Self-pay | Admitting: Internal Medicine

## 2017-03-31 NOTE — Assessment & Plan Note (Signed)
02/28/2017  Walked RA x 3 laps @ 185 ft each stopped due to  End of study, fast pace, min sob no desat    - CTa chest 03/04/2017 > declined  Very unlikely she has PE based on walking sats, chronicity of complaint dating back now almost a year s change doe so best next approach is cpst but wishes to put this off until after the holidays which is fine with me   I had an extended discussion with the patient reviewing all relevant studies completed to date and  lasting 15 to 20 minutes of a 25 minute visit    Each maintenance medication was reviewed in detail including most importantly the difference between maintenance and prns and under what circumstances the prns are to be triggered using an action plan format that is not reflected in the computer generated alphabetically organized AVS.    Please see AVS for specific instructions unique to this visit that I personally wrote and verbalized to the the pt in detail and then reviewed with pt  by my nurse highlighting any  changes in therapy recommended at today's visit to their plan of care.

## 2017-03-31 NOTE — Assessment & Plan Note (Signed)
DgEs  12/14/08  Dg Es > nimal hiatal hernia.  Mild to moderate gastroesophageal reflux.  Clearly has gerd which may account for dysphagia and unexplained sob but difficult to be sure > reviewed findings/ diet/ meds because pt flatly states "I don't think this could be reflux"

## 2017-05-13 HISTORY — PX: OTHER SURGICAL HISTORY: SHX169

## 2017-05-20 ENCOUNTER — Encounter (HOSPITAL_COMMUNITY): Payer: Medicare Other

## 2017-05-26 ENCOUNTER — Encounter (HOSPITAL_COMMUNITY): Payer: Medicare Other

## 2017-06-03 ENCOUNTER — Ambulatory Visit (HOSPITAL_COMMUNITY): Payer: Medicare Other | Attending: Internal Medicine

## 2017-06-03 DIAGNOSIS — R0609 Other forms of dyspnea: Secondary | ICD-10-CM | POA: Insufficient documentation

## 2017-06-03 DIAGNOSIS — R0600 Dyspnea, unspecified: Secondary | ICD-10-CM

## 2017-06-04 ENCOUNTER — Telehealth: Payer: Self-pay | Admitting: Internal Medicine

## 2017-06-04 NOTE — Telephone Encounter (Signed)
Pt is aware of results and voiced her understanding. Apt has ben scheduled for 06/09/17 to discuss test further. Nothing further is needed.   Notes recorded by Nyoka CowdenWert, Michael B, MD on 06/03/2017 at 5:33 PM EST Call patient : Study is unremarkable, excellent ex tol > Be sure patient has f/u ov so we can go over all the details of this study and get a plan together moving forward - ok to move up f/u if not feeling better and wants to be seen sooner

## 2017-06-09 ENCOUNTER — Other Ambulatory Visit (INDEPENDENT_AMBULATORY_CARE_PROVIDER_SITE_OTHER): Payer: Medicare Other

## 2017-06-09 ENCOUNTER — Ambulatory Visit (INDEPENDENT_AMBULATORY_CARE_PROVIDER_SITE_OTHER): Payer: Medicare Other | Admitting: Internal Medicine

## 2017-06-09 ENCOUNTER — Encounter: Payer: Self-pay | Admitting: Internal Medicine

## 2017-06-09 VITALS — BP 122/70 | HR 69 | Ht 62.5 in | Wt 118.0 lb

## 2017-06-09 DIAGNOSIS — R058 Other specified cough: Secondary | ICD-10-CM

## 2017-06-09 DIAGNOSIS — R05 Cough: Secondary | ICD-10-CM | POA: Diagnosis not present

## 2017-06-09 DIAGNOSIS — R0600 Dyspnea, unspecified: Secondary | ICD-10-CM

## 2017-06-09 DIAGNOSIS — R0609 Other forms of dyspnea: Secondary | ICD-10-CM

## 2017-06-09 LAB — CBC WITH DIFFERENTIAL/PLATELET
Basophils Absolute: 0 10*3/uL (ref 0.0–0.1)
Basophils Relative: 0.5 % (ref 0.0–3.0)
Eosinophils Absolute: 0.2 10*3/uL (ref 0.0–0.7)
Eosinophils Relative: 2.6 % (ref 0.0–5.0)
HCT: 39.7 % (ref 36.0–46.0)
Hemoglobin: 13.4 g/dL (ref 12.0–15.0)
Lymphocytes Relative: 18.1 % (ref 12.0–46.0)
Lymphs Abs: 1.3 10*3/uL (ref 0.7–4.0)
MCHC: 33.8 g/dL (ref 30.0–36.0)
MCV: 84.7 fl (ref 78.0–100.0)
Monocytes Absolute: 0.8 10*3/uL (ref 0.1–1.0)
Monocytes Relative: 11.8 % (ref 3.0–12.0)
Neutro Abs: 4.7 10*3/uL (ref 1.4–7.7)
Neutrophils Relative %: 67 % (ref 43.0–77.0)
Platelets: 263 10*3/uL (ref 150.0–400.0)
RBC: 4.68 Mil/uL (ref 3.87–5.11)
RDW: 13.8 % (ref 11.5–15.5)
WBC: 7 10*3/uL (ref 4.0–10.5)

## 2017-06-09 MED ORDER — PANTOPRAZOLE SODIUM 40 MG PO TBEC: 40.0000 mg | DELAYED_RELEASE_TABLET | Freq: Every day | ORAL | 2 refills | Status: DC

## 2017-06-09 MED ORDER — FAMOTIDINE 20 MG PO TABS: ORAL_TABLET | ORAL | 2 refills | Status: DC

## 2017-06-09 NOTE — Progress Notes (Signed)
Subjective:     Patient ID: Summer Davis, female   DOB: Nov 05, 1930,     MRN: [id number]     Brief patient profile:  34 yowf never smoker with onset of unexplained sob w/in a month of husband's death in December 31, 2017referred to pulmonary clinic 02/28/2017 by Dr  Azucena Cecil     History of Present Illness  02/28/2017 1st [facility] office visit/ Bear Osten   Chief Complaint  Patient presents with  . Pulmonary Consult    Referred by Dr. Tally Joe. Pt c/o DOE x 6 months- any outdoor activity, walking up an incline or cleaning her home makes her feel SOB.    onset of doe walking up to neighbors house first noted  in Jan 2018 - same route had always done before because her neighbor is [facility] -  and happens every time and makes it to  house and sits down and recovers w/in 5 min  / worse when carries   Anything more than 5 lbs/ no assoc chest pain, nausea or diaphoresis, same pattern in all weather conditions. Has assoc dysphagia but not overt hb about the same period time and does carry dx of gerd rec Stop tums Pantoprazole (protonix) 40 mg   Take  30-60 min before first meal of the day and Pepcid (famotidine)  20 mg one @  bedtime until return to office - this is the best way to tell whether stomach acid is contributing to your problem.   GERD diet     03/28/2017  f/u ov/Luisfelipe Engelstad re: unexplained sob and  uacs since Dec 31, 2017with documented mild /mod gerd Chief Complaint  Patient presents with  . Follow-up    Breathing is "a little bit better" No new co's today.   still has occ dyshagia/ takes vits with applesauce / using calcium carbonate but otherwise following appears to be following all instructions rec Stay on protonix and pepcid and the diet and be sure your calcium is the gluconate not the carbonate Please see patient coordinator before you leave today  to schedule CPST after the holidays and I will call you with the results If swallowing problem does not improve you will need a need GI eval   Per your PCP or by our group upstairs as you have documented reflux that needs follow up  but ok wait until after the holidays  CPST 06/03/17 excellent ex capacity, mild hyperventilation at  Peak with elevated bp but no ischemia, no asthma provoked    06/09/2017  f/u ov/Macrina Lehnert re: unexplained sob/ now with worse cough since last ov  Chief Complaint  Patient presents with  . Follow-up    Breathing has improved. No new co's today.      Dyspnea:  Not limited by breathing from desired activities   Cough:  Daytime > noct, min mucus better with allegra/ not productive  Sleep: limited by restless legs  No obvious day to day or daytime variability or assoc excess/ purulent sputum or mucus plugs or hemoptysis or cp or chest tightness, subjective wheeze or overt sinus or hb symptoms. No unusual exposure hx or h/o childhood pna/ asthma or knowledge of premature birth.  Sleeping ok flat without nocturnal  or early am exacerbation  of respiratory  c/o's or need for noct saba. Also denies any obvious fluctuation of symptoms with weather or environmental changes or other aggravating or alleviating factors except as outlined above   Current Allergies, Complete Past Medical History, Past Surgical History, Family History,  and Social History were reviewed in [facility] electronic medical record.  ROS  The following are not active complaints unless bolded Hoarseness, sore throat, dysphagia, dental problems, itching, sneezing,  nasal congestion or discharge of excess mucus or purulent secretions, ear ache,   fever, chills, sweats, unintended wt loss or wt gain, classically pleuritic or exertional cp,  orthopnea pnd or leg swelling, presyncope, palpitations, abdominal pain, anorexia, nausea, vomiting, diarrhea  or change in bowel habits or change in bladder habits, change in stools or change in urine, dysuria, hematuria,  rash, arthralgias, visual complaints, headache, numbness, weakness or ataxia or problems with  walking or coordination,  change in mood/affect or memory.        Current Meds  Medication Sig  . Apoaequorin (PREVAGEN PO) Take by mouth daily.  Marland Kitchen. aspirin 81 MG tablet Take 81 mg by mouth daily.  . Biotin 10 MG TABS Take 1 tablet daily by mouth.  . Calcium Carb-Cholecalciferol (CALCIUM 600+D) 600-800 MG-UNIT TABS Take 1 tablet daily by mouth.  . famotidine (PEPCID) 20 MG tablet One at bedtime  . gabapentin (NEURONTIN) 100 MG capsule TK 1 Cap PO IN THE MORNING AND 3 Caps PO IN THE EVE UTD  . levothyroxine (SYNTHROID, LEVOTHROID) 75 MCG tablet Take 75 mcg by mouth every other day. SUN, Tuesday, Thursday, Sat  . levothyroxine (SYNTHROID, LEVOTHROID) 88 MCG tablet Take 88 mcg by mouth every other day. MON, WED, FRIDAY  . Multiple Vitamins-Minerals (MULTIVITAMIN WITH MINERALS) tablet Take 1 tablet by mouth daily.  . Multiple Vitamins-Minerals (PRESERVISION AREDS 2) CAPS Take 2 capsules by mouth daily.  . pantoprazole (PROTONIX) 40 MG tablet Take 1 tablet (40 mg total) by mouth daily. Take 30-60 min before first meal of the day  . Polyethyl Glycol-Propyl Glycol (SYSTANE) 0.4-0.3 % SOLN Place 1 drop into both eyes daily as needed (for dry eyes).  Marland Kitchen. zolpidem (AMBIEN CR) 6.25 MG CR tablet Take 6.25 mg at bedtime as needed by mouth for sleep.  .      .                      Objective:   Physical Exam  Thin wf nad / abn affect/ freq throat clearing / no candy handy    06/09/2017         118  03/28/2017      116  02/28/17 114 lb (51.7 kg)  01/03/17 115 lb 9.6 oz (52.4 kg)  10/31/16 114 lb (51.7 kg)    Vital signs reviewed - Note on arrival 02 sats  99% on RA      HEENT: nl   turbinates bilaterally, and oropharynx which is pristine . Nl external ear canals without cough reflex - top dentures    NECK :  without JVD/Nodes/TM/ nl carotid upstrokes bilaterally   LUNGS: no acc muscle use,  Mild to moderate kyphoscoliotic deformity to chest wall but  clear to A and P bilaterally  without cough on insp or exp maneuvers   CV:  RRR  no s3 or murmur or increase in P2, and no edema   ABD:  soft and nontender with nl inspiratory excursion in the supine position. No bruits or organomegaly appreciated, bowel sounds nl  MS:  Nl gait/ ext warm without deformities, calf tenderness, cyanosis or clubbing No obvious joint restrictions   SKIN: warm and dry without lesions    NEURO:  alert, approp, nl sensorium with  no motor or cerebellar deficits apparent.  Assessment:

## 2017-06-09 NOTE — Patient Instructions (Addendum)
For drainage / throat tickle try take CHLORPHENIRAMINE  4 mg - take one every 4 hours as needed - available over the counter- may cause drowsiness so start with just a bedtime dose or two and see how you tolerate it before trying in daytime    GERD (REFLUX)  is an extremely common cause of respiratory symptoms just like yours , many times with no obvious heartburn at all.    It can be treated with medication, but also with lifestyle changes including elevation of the head of your bed (ideally with 6 inch  bed blocks),  Smoking cessation, avoidance of late meals, excessive alcohol, and avoid fatty foods, chocolate, peppermint, colas, red wine, and acidic juices such as orange juice.  NO MINT OR MENTHOL PRODUCTS SO NO COUGH DROPS  USE SUGARLESS CANDY INSTEAD (Jolley ranchers or Stover's or Life Savers) or even ice chips will also do - the key is to swallow to prevent all throat clearing. NO OIL BASED VITAMINS - use powdered substitutes.    Please remember to go to the lab department downstairs in the basement  for your tests - we will call you with the results when they are available.       If you are satisfied with your treatment plan,  let your doctor know and he/she can either refill your medications or you can return here when your prescription runs out.     If in any way you are not 100% satisfied,  please tell us.  If 100% better, tell your friends!  Pulmonary follow up is as needed

## 2017-06-10 ENCOUNTER — Telehealth: Payer: Self-pay | Admitting: Internal Medicine

## 2017-06-10 ENCOUNTER — Encounter: Payer: Self-pay | Admitting: Internal Medicine

## 2017-06-10 LAB — RESPIRATORY ALLERGY PROFILE REGION II ~~LOC~~

## 2017-06-10 LAB — INTERPRETATION:

## 2017-06-10 NOTE — Assessment & Plan Note (Signed)
02/28/2017  Walked RA x 3 laps @ 185 ft each stopped due to  End of study, fast pace, min sob no desat    - CTa chest 03/04/2017 > declined - CPST 06/03/2017 Exercise testing with gas exchange demonstrates excellent functional capacity when compared to matched sedentary norms. There is no indication for cardiopulmonary exercise or exercise-induced asthma. In fact patient is quite conditioned for her aged-matched norms. Patient was nearing ventilatory limits at peak exercise, most likely due to hyperventilation at high intensity exercise. There was mild hypertension at rest and with incremental exercise.   I had an extended final summary discussion with the patient reviewing all relevant studies completed to date and  lasting 15 to 20 minutes of a 25 minute visit on the following issues:    Now states no longer limited on exertion as long as she paces herself and declines further evaluation at this time, happy that she did so well on the cpst which was reviewed in detail with her   Pulmonary f/u is prn

## 2017-06-10 NOTE — Assessment & Plan Note (Signed)
DgEs  12/14/08  Dg Es > small  hiatal hernia.  Mild to moderate gastroesophageal reflux. - Allergy profile 06/09/2017 >  Eos 0.2 /  IgE    Throat clearing appears habitual at this point c/w irritable larynx but will first w/u for allergy and try adding 1st gen H1 blockers per guidelines    If persists s  Pos allergy testing or response to gerd rx then consider trial of gabapentin 100 tid or [id number]

## 2017-06-10 NOTE — Progress Notes (Signed)
lmtcb

## 2017-06-10 NOTE — Telephone Encounter (Signed)
Called pt letting her know the results of her labwork. Pt expressed understanding. Nothing further needed at this current time.

## 2018-04-07 DIAGNOSIS — K219 Gastro-esophageal reflux disease without esophagitis: Secondary | ICD-10-CM | POA: Insufficient documentation

## 2018-04-07 DIAGNOSIS — R1312 Dysphagia, oropharyngeal phase: Secondary | ICD-10-CM | POA: Insufficient documentation

## 2018-04-07 DIAGNOSIS — R490 Dysphonia: Secondary | ICD-10-CM | POA: Insufficient documentation

## 2018-04-13 ENCOUNTER — Other Ambulatory Visit: Payer: Self-pay | Admitting: Otolaryngology

## 2018-04-13 DIAGNOSIS — R1312 Dysphagia, oropharyngeal phase: Secondary | ICD-10-CM

## 2018-04-15 ENCOUNTER — Ambulatory Visit
Admission: RE | Admit: 2018-04-15 | Discharge: 2018-04-15 | Disposition: A | Discharge status: Home / Self Care | Payer: Medicare Other | Source: Ambulatory Visit | Attending: Otolaryngology | Admitting: Otolaryngology

## 2018-04-15 DIAGNOSIS — R1312 Dysphagia, oropharyngeal phase: Secondary | ICD-10-CM

## 2018-07-09 ENCOUNTER — Telehealth: Payer: Self-pay | Admitting: Internal Medicine

## 2018-07-09 NOTE — Telephone Encounter (Signed)
Will await RB response 

## 2018-07-09 NOTE — Telephone Encounter (Signed)
Fine with me

## 2018-07-09 NOTE — Telephone Encounter (Addendum)
Pt's daughter in law, Rosey Bath, left a message with Philipp Deputy that pt would like to change providers from [facility] to [facility]. Pt last seen by MW in 05/2017 and was informed to follow up as needed. Pt is now requesting to see RB. Appt has been preemptively made with RB on 07/17/2018.   MW please advise if ok to change providers. RB please advise if ok to change to you.   Thank you!

## 2018-07-13 NOTE — Telephone Encounter (Signed)
OK with me too  

## 2018-07-13 NOTE — Telephone Encounter (Signed)
Called and spoke with patients daughter in law. They are aware of appointment. Nothing further needed.   Dr. Erling Conte is doing a formal referral.

## 2018-07-17 ENCOUNTER — Encounter: Payer: Self-pay | Admitting: Emergency Medicine

## 2018-07-17 ENCOUNTER — Ambulatory Visit (INDEPENDENT_AMBULATORY_CARE_PROVIDER_SITE_OTHER): Payer: Medicare Other | Admitting: Emergency Medicine

## 2018-07-17 VITALS — BP 144/66 | HR 100 | Ht 62.0 in | Wt 114.0 lb

## 2018-07-17 DIAGNOSIS — J189 Pneumonia, unspecified organism: Secondary | ICD-10-CM | POA: Diagnosis not present

## 2018-07-17 DIAGNOSIS — R9389 Abnormal findings on diagnostic imaging of other specified body structures: Secondary | ICD-10-CM | POA: Insufficient documentation

## 2018-07-17 NOTE — Patient Instructions (Addendum)
We will perform a CT scan of your chest with contrast to evaluate your left lung opacity. Continue to work on slowly building up your strength and stamina. We will follow-up to review your CT scan together afterwards been completed.  Depending on the results we will decide whether to wait and watch with serial films versus perform further diagnostic testing

## 2018-07-17 NOTE — Addendum Note (Signed)
Addended by: Jaynee Eagles C on: 07/17/2018 04:13 PM   Modules accepted: Orders

## 2018-07-17 NOTE — Assessment & Plan Note (Signed)
She has a left midlung opacity that is noted on chest x-ray from 2/24.  I reviewed.  I do believe I can see air bronchograms and it appears to be in a segmental distribution.  The entire clinical history would be consistent with a pneumonia, community-acquired or aspiration, including the chest discomfort, weakness and fatigue, leukocytosis, etc.  The only strange aspect is that she was not coughing.  She does feel better although not back to baseline.  Her leukocytosis apparently resolved with doxycycline.  I suspect that this is a pneumonia.  All the same I think it would be reasonable to better characterize the opacity with a CT chest.  If the CT is also consistent with an infiltrate then we can wait and watch for resolution.  Next imaging likely about 6 weeks after the first chest x-ray.  If the left lung opacity has characteristics more consistent with a mass or other atypical finding then we will discuss possible bronchoscopy.  Explained this to her today.

## 2018-07-17 NOTE — Progress Notes (Signed)
Subjective:    Patient ID: Summer Davis, female    DOB: 06/29/1930, 85 y.o.   MRN: [id number]  HPI Summer Davis is an 85 year old never smoker with a history of GERD, scoliosis, hypothyroidism, hyperlipidemia, varicose veins.  She has been seen in our office before by Dr. Sherene Sires for unexplained dyspnea.  She underwent an CPST 06/03/2017 that I reviewed, showed normal pre-and post exercise spirometry, excellent functional capacity good ventilatory and cardiac responses to exercise.  She is here now to evaluate an abnormal CXR.This a new problem, new consultation. She reports that she was well, then developed progressive HA, fatigue / weakness, nausea (no emesis), L-sided CP, low grade fever 2/22-2/23. She went to UC on 2/24, had a leukocytosis. CXR was done, showed a L mid-lung opacity. Was treated with doxycycline - she has felt better, less fatigued although not back to baseline. Her energy level has not returned to normal yet. Weight is stable. Still has mild L chest discomfort, better. Repeat WBC this week had normalized.    Review of Systems  Constitutional: Positive for activity change, appetite change, fatigue and fever. Negative for chills and unexpected weight change.  HENT: Positive for congestion, rhinorrhea and trouble swallowing.   Respiratory: Positive for chest tightness. Negative for apnea, cough, choking, shortness of breath, wheezing and stridor.   Cardiovascular: Positive for chest pain.  Gastrointestinal: Positive for nausea.  Musculoskeletal: Negative.   Neurological: Positive for weakness and headaches. Negative for tremors and light-headedness.     Past Medical History:  Diagnosis Date  . Arthritis   . DOE (dyspnea on exertion)    Evaluated in 2013 with a Myoview that was negative for ischemia  . Esophageal reflux   . GERD (gastroesophageal reflux disease)   . H/O scoliosis   . Hypercholesterolemia   . Hypothyroidism   . Insomnia   . Lichen sclerosus   . Memory  difficulty   . PONV (postoperative nausea and vomiting)   . Tortuous colon   . UTI (lower urinary tract infection)   . Varicose veins of both lower extremities with pain    h/x varicose veins - with endoscopic vein ablation     Family History  Problem Relation Age of Onset  . Heart disease Father        Later onset CAD and heart failure  . Cancer Mother        Liver and pancreas  . Hypertension Sister   . Hyperlipidemia Brother      Social History   Socioeconomic History  . Marital status: Widowed    Spouse name: Summer Davis  . Number of children: 4  . Years of education: 63  . Highest education level: Not on file  Occupational History    Employer: RETIRED    Comment: [employer]  Social Needs  . Financial resource strain: Not on file  . Food insecurity:    Worry: Not on file    Inability: Not on file  . Transportation needs:    Medical: Not on file    Non-medical: Not on file  Tobacco Use  . Smoking status: Never Smoker  . Smokeless tobacco: Never Used  Substance and Sexual Activity  . Alcohol use: No  . Drug use: No  . Sexual activity: Not on file  Lifestyle  . Physical activity:    Days per week: Not on file    Minutes per session: Not on file  . Stress: Not on file  Relationships  . Social connections:  Talks on phone: Not on file    Gets together: Not on file    Attends religious service: Not on file    Active member of club or organization: Not on file    Attends meetings of clubs or organizations: Not on file    Relationship status: Not on file  . Intimate partner violence:    Fear of current or ex partner: Not on file    Emotionally abused: Not on file    Physically abused: Not on file    Forced sexual activity: Not on file  Other Topics Concern  . Not on file  Social History Narrative   Patient lives at home alone. She is wheezing a widowed - her husband (Summer Davis) died less than half a year ago..    High school education.  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   Caffeine- one cup coffee daily.   - She did have prolonged secondhand smoke exposure from her first husband.   One child deceased -> he died of overdose of prescription medications   3 remaining sons aged 11, 56 and 19. -> Also one stepchild. She has a total of 7 grandchildren of her home with three-step grandchildren. 10+ great-grandchildren.     Allergies  Allergen Reactions  . Alendronate Sodium   . Codeine   . Lipitor [Atorvastatin]   . Oxycodone Nausea And Vomiting  . Zetia [Ezetimibe]   . Zocor [Simvastatin]      Outpatient Medications Prior to Visit  Medication Sig Dispense Refill  . Apoaequorin (PREVAGEN PO) Take by mouth daily.    . Biotin 10 MG TABS Take 1 tablet daily by mouth.    . Calcium Carb-Cholecalciferol (CALCIUM 600+D) 600-800 MG-UNIT TABS Take 1 tablet daily by mouth.    . gabapentin (NEURONTIN) 100 MG capsule TK 1 Cap PO IN THE MORNING AND 3 Caps PO IN THE EVE UTD  5  . levothyroxine (SYNTHROID, LEVOTHROID) 75 MCG tablet Take 75 mcg by mouth every other day. SUN, Tuesday, Thursday, Sat    . levothyroxine (SYNTHROID, LEVOTHROID) 88 MCG tablet Take 88 mcg by mouth every other day. MON, WED, FRIDAY    . Multiple Vitamins-Minerals (MULTIVITAMIN WITH MINERALS) tablet Take 1 tablet by mouth daily.    . Multiple Vitamins-Minerals (PRESERVISION AREDS 2) CAPS Take 2 capsules by mouth daily.    [name] 0.4-0.3 % SOLN Place 1 drop into both eyes daily as needed (for dry eyes).    Marland Kitchen zolpidem (AMBIEN CR) 6.25 MG CR tablet Take 6.25 mg at bedtime as needed by mouth for sleep.    Marland Kitchen aspirin 81 MG tablet Take 81 mg by mouth daily.    . famotidine (PEPCID) 20 MG tablet One at bedtime 30 tablet 2  . pantoprazole (PROTONIX) 40 MG tablet Take 1 tablet (40 mg total) by mouth daily. Take 30-60 min before first meal of the day 30 tablet 2    No facility-administered medications prior to visit.          Objective:   Physical Exam Vitals:   07/17/18 1506  BP: (!) 144/66  Pulse: 100  SpO2: 98%  Weight: 114 lb (51.7 kg)  Height: 5\' 2"  (1.575 m)   Gen: Pleasant, well-nourished, in no distress,  normal affect  ENT: No lesions,  mouth clear,  oropharynx clear, no postnasal drip  Neck: No JVD, no stridor  Lungs: No  use of accessory muscles, no crackles or wheezing on normal respiration, no wheeze on forced expiration  Cardiovascular: RRR, heart sounds normal, no murmur or gallops, no peripheral edema  Musculoskeletal: No deformities, no cyanosis or clubbing  Neuro: alert, awake, non focal  Skin: Warm, no lesions or rash     Assessment & Plan:  Abnormal chest x-ray She has a left midlung opacity that is noted on chest x-ray from 2/24.  I reviewed.  I do believe I can see air bronchograms and it appears to be in a segmental distribution.  The entire clinical history would be consistent with a pneumonia, community-acquired or aspiration, including the chest discomfort, weakness and fatigue, leukocytosis, etc.  The only strange aspect is that she was not coughing.  She does feel better although not back to baseline.  Her leukocytosis apparently resolved with doxycycline.  I suspect that this is a pneumonia.  All the same I think it would be reasonable to better characterize the opacity with a CT chest.  If the CT is also consistent with an infiltrate then we can wait and watch for resolution.  Next imaging likely about 6 weeks after the first chest x-ray.  If the left lung opacity has characteristics more consistent with a mass or other atypical finding then we will discuss possible bronchoscopy.  Explained this to her today.  Levy Pupa, MD, PhD 07/17/2018, 3:46 PM [facility] [phone] or if no answer [phone]

## 2018-07-24 ENCOUNTER — Other Ambulatory Visit: Payer: Medicare Other

## 2018-07-24 DIAGNOSIS — J189 Pneumonia, unspecified organism: Secondary | ICD-10-CM

## 2018-07-25 LAB — BASIC METABOLIC PANEL
BUN: 19 mg/dL (ref 7–25)
CO2: 30 mmol/L (ref 20–32)
Calcium: 9.2 mg/dL (ref 8.6–10.4)
Chloride: 103 mmol/L (ref 98–110)
Creat: 0.85 mg/dL (ref 0.60–0.88)
Glucose, Bld: 119 mg/dL — ABNORMAL HIGH (ref 65–99)
Potassium: 4.9 mmol/L (ref 3.5–5.3)
Sodium: 139 mmol/L (ref 135–146)

## 2018-07-27 ENCOUNTER — Ambulatory Visit (INDEPENDENT_AMBULATORY_CARE_PROVIDER_SITE_OTHER)
Admission: RE | Admit: 2018-07-27 | Discharge: 2018-07-27 | Disposition: A | Discharge status: Home / Self Care | Payer: Medicare Other | Source: Ambulatory Visit | Attending: Emergency Medicine | Admitting: Emergency Medicine

## 2018-07-27 ENCOUNTER — Other Ambulatory Visit: Payer: Self-pay

## 2018-07-27 ENCOUNTER — Other Ambulatory Visit: Payer: Medicare Other

## 2018-07-27 DIAGNOSIS — J189 Pneumonia, unspecified organism: Secondary | ICD-10-CM

## 2018-07-27 MED ORDER — IOHEXOL 300 MG/ML  SOLN
80.0000 mL | Freq: Once | INTRAMUSCULAR | Status: AC | PRN
  Administered 2018-07-27: 80 mL via INTRAVENOUS

## 2018-08-04 ENCOUNTER — Telehealth: Payer: Self-pay | Admitting: Emergency Medicine

## 2018-08-04 NOTE — Telephone Encounter (Signed)
Returned phone call to daughter in law (teresa on dpr scanned in), she is concerned about the nodule that was found on the patient's Chest CT. She states they were given the results but she would like to discuss Lung nodule in detail with RB because he states he may possibly due a bronch.   RB please advise. Patient aware you are not in office and may not receive a call until later in week. She states she is more than willing to wait until next week if she needs to but she just wanted your recommendations.

## 2018-08-06 NOTE — Telephone Encounter (Signed)
Called and spoke with pt's daughter-in-law Rosey Bath. Rosey Bath is wanting RB to call her in regards to pt's recent  CT.   Rosey Bath can be reached at [phone] and stated that anytime would be fine for RB to call her to go over the results.  Dr. Delton Coombes, please see above and previous message from [place], LPN. Thanks!

## 2018-08-06 NOTE — Telephone Encounter (Signed)
Pt's daughter-in law Aggie Cosier is calling about CT  [phone]

## 2018-08-07 ENCOUNTER — Ambulatory Visit (INDEPENDENT_AMBULATORY_CARE_PROVIDER_SITE_OTHER): Payer: Medicare Other | Admitting: Pulmonary Disease

## 2018-08-07 ENCOUNTER — Encounter: Payer: Self-pay | Admitting: Pulmonary Disease

## 2018-08-07 ENCOUNTER — Other Ambulatory Visit: Payer: Self-pay

## 2018-08-07 DIAGNOSIS — R9389 Abnormal findings on diagnostic imaging of other specified body structures: Secondary | ICD-10-CM

## 2018-08-07 NOTE — Assessment & Plan Note (Signed)
A:  Feb/2020 - chest xray showing LUL opacity  Mar/2020 - CT Chest - resolved left upper lobe pnu with residual post infectious scarring   P:  Reviewed the CT results with daughter-in-law and son Reviewed CT results with patient Follow-up in 6 months Continue to monitor breathing clinically

## 2018-08-07 NOTE — Progress Notes (Signed)
Virtual Visit via Telephone Note  I connected with Summer Davis on 08/07/18 at  2:00 PM EDT by telephone and verified that I am speaking with the correct person using two identifiers.   I discussed the limitations, risks, security and privacy concerns of performing an evaluation and management service by telephone and the availability of in person appointments. I also discussed with the patient that there may be a patient responsible charge related to this service. The patient expressed understanding and agreed to proceed.   History of Present Illness:  Patient consented to consult via telephone: yes People present and their role in pt care: Pt DTR in law (nurse), PT Son Brett Canales, Pt   Chief complaint: Abnormal CXRY - CT results   85 year old female never smoker with a past medical history of GERD, scoliosis, hypothyroidism, hyperlipidemia, varicose veins.  Patient was seen by Dr. Delton Coombes in February/2020 after completing a visit to urgent care where she was treated with doxycycline for a pneumonia.  Chest x-ray completed on 07/06/2018 showing left midlung opacity.  This prompted Dr. Delton Coombes to complete a CT chest with contrast to further evaluate the left upper lobe pneumonia.  07/27/2018 CT chest with contrast shows a resolved left upper lobe pneumonia with minimal residual postinfectious scarring.  Family just wanted to review plan of care after reviewing CT to see if the patient would need a bronchoscopy which is something that was discussed at the office visit with Dr. Delton Coombes.  Family reports that patient has been clinically stable.  She continues to be afebrile.  She is eating well.  She has not had worsened breathing.  She has no body aches or chills.   Observations/Objective:  07/27/2018-CT chest with contrast-resolved left upper lobe pneumonia with minimal residual postinfectious scarring, aortic arthrosclerosis  07/06/2018- left midlung opacity that is noted on chest  x-ray  01/14/2017-echocardiogram- LV ejection fraction 55 to 60%, grade 1 diastolic dysfunction  06/03/2017-cardiopulmonary exercise test- excellent exercise tolerance  Assessment and Plan:  Abnormal chest x-ray A:  Feb/2020 - chest xray showing LUL opacity  Mar/2020 - CT Chest - resolved left upper lobe pnu with residual post infectious scarring   P:  Reviewed the CT results with daughter-in-law and son Reviewed CT results with patient Follow-up in 6 months Continue to monitor breathing clinically  Follow Up Instructions:  Follow up in 6 months with Dr. Delton Coombes    I discussed the assessment and treatment plan with the patient. The patient was provided an opportunity to ask questions and all were answered. The patient agreed with the plan and demonstrated an understanding of the instructions.   The patient was advised to call back or seek an in-person evaluation if the symptoms worsen or if the condition fails to improve as anticipated.  I provided 23 minutes of non-face-to-face time during this encounter.   Coral Ceo, NP

## 2018-08-07 NOTE — Patient Instructions (Addendum)
CT results are reassuring and they show the left upper lobe pneumonia has resolved with minimal residual scarring, this is good news  Follow-up with our office in 6 months with Dr. Delton Coombes      Coronavirus (COVID-19) Are you at risk?  Are you at risk for the Coronavirus (COVID-19)?  To be considered HIGH RISK for Coronavirus (COVID-19), you have to meet the following criteria:  . Traveled to [place], [place], [place], [place] or [place]; or in the [place] to [place], [place], [place], or [place]; and have fever, cough, and shortness of breath within the last 2 weeks of travel OR . Been in close contact with a person diagnosed with COVID-19 within the last 2 weeks and have fever, cough, and shortness of breath . IF YOU DO NOT MEET THESE CRITERIA, YOU ARE CONSIDERED LOW RISK FOR COVID-19.  What to do if you are HIGH RISK for COVID-19?  Marland Kitchen If you are having a medical emergency, call 911. . Seek medical care right away. Before you go to a doctor's office, urgent care or emergency department, call ahead and tell them about your recent travel, contact with someone diagnosed with COVID-19, and your symptoms. You should receive instructions from your physician's office regarding next steps of care.  . When you arrive at healthcare provider, tell the healthcare staff immediately you have returned from visiting [place], [place], [place], [place] or [place]; or traveled in the [place] to [place], [place], [place], or [place]; in the last two weeks or you have been in close contact with a person diagnosed with COVID-19 in the last 2 weeks.   . Tell the health care staff about your symptoms: fever, cough and shortness of breath. . After you have been seen by a medical provider, you will be either: o Tested for (COVID-19) and discharged home on quarantine except to seek medical care if symptoms worsen, and asked to  - Stay home and avoid contact with others until you get your results  (4-5 days)  - Avoid travel on public transportation if possible (such as bus, train, or airplane) or o Sent to the Emergency Department by EMS for evaluation, COVID-19 testing, and possible admission depending on your condition and test results.  What to do if you are LOW RISK for COVID-19?  Reduce your risk of any infection by using the same precautions used for avoiding the common cold or flu:  Marland Kitchen Wash your hands often with soap and warm water for at least 20 seconds.  If soap and water are not readily available, use an alcohol-based hand sanitizer with at least 60% alcohol.  . If coughing or sneezing, cover your mouth and nose by coughing or sneezing into the elbow areas of your shirt or coat, into a tissue or into your sleeve (not your hands). . Avoid shaking hands with others and consider head nods or verbal greetings only. . Avoid touching your eyes, nose, or mouth with unwashed hands.  . Avoid close contact with people who are sick. . Avoid places or events with large numbers of people in one location, like concerts or sporting events. . Carefully consider travel plans you have or are making. . If you are planning any travel outside or inside the [place], visit the CDC's Travelers' Health webpage for the latest health notices. . If you have some symptoms but not all symptoms, continue to monitor at home and seek medical attention if your symptoms worsen. . If  you are having a medical emergency, call 911.   ADDITIONAL HEALTHCARE OPTIONS FOR PATIENTS  [facility] / e-Visit: [email or web address]         [facility]: [phone]  [facility]: [phone]                   [facility]: [phone]           It is flu season:   >>> Best ways to protect herself from the flu: Receive the yearly flu vaccine, practice good hand hygiene washing with soap and also using hand sanitizer when  available, eat a nutritious meals, get adequate rest, hydrate appropriately   Please contact the office if your symptoms worsen or you have concerns that you are not improving.   Thank you for choosing [facility] for your healthcare, and for allowing [place] to partner with you on your healthcare journey. I am thankful to be able to provide care to you today.   Elisha Headland FNP-C

## 2018-08-11 ENCOUNTER — Ambulatory Visit: Payer: Medicare Other | Admitting: Emergency Medicine

## 2018-08-11 NOTE — Telephone Encounter (Signed)
Called and spoke with patient's daughter in law Summer Davis to make sure all concerns had been addressed. They had telephone visit with B.Mack on 08/07/2018  2. Coral Ceo, NP (Nurse Practitioner) at 08/07/2018 1:21 PM - Signed    CT results are reassuring and they show the left upper lobe pneumonia has resolved with minimal residual scarring, this is good news  Follow-up with our office in 6 months with Dr. Hansel Starling verbalized and expressed understanding. Nothing further needed at this time.

## 2019-02-05 ENCOUNTER — Ambulatory Visit (INDEPENDENT_AMBULATORY_CARE_PROVIDER_SITE_OTHER): Payer: Medicare Other | Admitting: Emergency Medicine

## 2019-02-05 ENCOUNTER — Encounter: Payer: Self-pay | Admitting: Emergency Medicine

## 2019-02-05 ENCOUNTER — Other Ambulatory Visit: Payer: Self-pay

## 2019-02-05 DIAGNOSIS — R0609 Other forms of dyspnea: Secondary | ICD-10-CM

## 2019-02-05 DIAGNOSIS — R0600 Dyspnea, unspecified: Secondary | ICD-10-CM

## 2019-02-05 DIAGNOSIS — R9389 Abnormal findings on diagnostic imaging of other specified body structures: Secondary | ICD-10-CM

## 2019-02-05 NOTE — Patient Instructions (Signed)
Your CT scan of the chest shows that your left lung pneumonia has resolved.  This is good news. I believe that some of your shortness of breath is due to the fact that you have not been able to be mobile or do exercise and he lost some of your conditioning.  We need to work hard on building this back up slowly and steadily. Walking oximetry today on room air Follow with Dr Lamonte Sakai if needed.

## 2019-02-05 NOTE — Assessment & Plan Note (Signed)
Exercise testing was reassuring, no evidence of underlying lung disease or heart disease.  We will rule out occult desaturation today but suspect that she is dealing with restriction from her scoliosis and also deconditioning due to her recent immobility.  Reassured her about her cardiac and pulmonary function

## 2019-02-05 NOTE — Assessment & Plan Note (Signed)
Consistent with a left pneumonia, resolved on repeat CT 07/27/2018.

## 2019-02-05 NOTE — Progress Notes (Signed)
Subjective:    Patient ID: Summer Davis, female    DOB: 1931/05/01, 85 y.o.   MRN: [id number]  HPI Summer Davis is an 85 year old never smoker with a history of GERD, scoliosis, hypothyroidism, hyperlipidemia, varicose veins.  She has been seen in our office before by Dr. Sherene SiresWert for unexplained dyspnea.  She underwent an CPST 06/03/2017 that I reviewed, showed normal pre-and post exercise spirometry, excellent functional capacity good ventilatory and cardiac responses to exercise.  She is here now to evaluate an abnormal CXR.This a new problem, new consultation. She reports that she was well, then developed progressive HA, fatigue / weakness, nausea (no emesis), L-sided CP, low grade fever 2/22-2/23. She went to UC on 2/24, had a leukocytosis. CXR was done, showed a L mid-lung opacity. Was treated with doxycycline - she has felt better, less fatigued although not back to baseline. Her energy level has not returned to normal yet. Weight is stable. Still has mild L chest discomfort, better. Repeat WBC this week had normalized.   ROV 02/05/2019 --follow-up visit for 85 year old woman with GERD, scoliosis, hypothyroidism, hyperlipidemia.  I saw her for an abnormal chest x-ray in the setting of what sounded like a community-acquired pneumonia.  I performed a CT chest 07/27/2018 to ensure resolution and no other parenchymal abnormality.  This film was reassuring, showed resolution of her left upper lobe infiltrate with minimal residual postinfectious scarring. She tells me that she continues to have exertional SOB, some limitation on her ability to do housework, etc. She tries to work in the yard. She has has not been able to go to the gym due to isolation.    Review of Systems  Constitutional: Positive for activity change and fatigue. Negative for appetite change, chills, fever and unexpected weight change.  HENT: Positive for congestion, rhinorrhea and trouble swallowing.   Respiratory: Positive for  shortness of breath. Negative for apnea, cough, choking, chest tightness, wheezing and stridor.   Cardiovascular: Negative for chest pain.  Gastrointestinal: Negative for nausea.  Musculoskeletal: Negative.   Neurological: Positive for weakness. Negative for tremors, light-headedness and headaches.     Past Medical History:  Diagnosis Date  . Arthritis   . DOE (dyspnea on exertion)    Evaluated in 2013 with a Myoview that was negative for ischemia  . Esophageal reflux   . GERD (gastroesophageal reflux disease)   . H/O scoliosis   . Hypercholesterolemia   . Hypothyroidism   . Insomnia   . Lichen sclerosus   . Memory difficulty   . PONV (postoperative nausea and vomiting)   . Tortuous colon   . UTI (lower urinary tract infection)   . Varicose veins of both lower extremities with pain    h/x varicose veins - with endoscopic vein ablation     Family History  Problem Relation Age of Onset  . Heart disease Father        Later onset CAD and heart failure  . Cancer Mother        Liver and pancreas  . Hypertension Sister   . Hyperlipidemia Brother      Social History   Socioeconomic History  . Marital status: Widowed    Spouse name: Winfred  . Number of children: 4  . Years of education: 10412  . Highest education level: Not on file  Occupational History    Employer: RETIRED    Comment: [employer]  Social Needs  . Financial resource strain: Not on file  . Food insecurity  Worry: Not on file    Inability: Not on file  . Transportation needs    Medical: Not on file    Non-medical: Not on file  Tobacco Use  . Smoking status: Never Smoker  . Smokeless tobacco: Never Used  Substance and Sexual Activity  . Alcohol use: No  . Drug use: No  . Sexual activity: Not on file  Lifestyle  . Physical activity    Days per week: Not on file    Minutes per session: Not on file  . Stress: Not on file  Relationships  . Social Herbalist on phone: Not on  file    Gets together: Not on file    Attends religious service: Not on file    Active member of club or organization: Not on file    Attends meetings of clubs or organizations: Not on file    Relationship status: Not on file  . Intimate partner violence    Fear of current or ex partner: Not on file    Emotionally abused: Not on file    Physically abused: Not on file    Forced sexual activity: Not on file  Other Topics Concern  . Not on file  Social History Narrative   Patient lives at home alone. She is wheezing a widowed - her husband (Winfred) died less than half a year ago..    High school education.  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   Caffeine- one cup coffee daily.   - She did have prolonged secondhand smoke exposure from her first husband.   One child deceased -> he died of overdose of prescription medications   3 remaining sons aged 43, 71 and 64. -> Also one stepchild. She has a total of 7 grandchildren of her home with three-step grandchildren. 10+ great-grandchildren.     Allergies  Allergen Reactions  . Alendronate Sodium   . Codeine   . Lipitor [Atorvastatin]   . Oxycodone Nausea And Vomiting  . Zetia [Ezetimibe]   . Zocor [Simvastatin]      Outpatient Medications Prior to Visit  Medication Sig Dispense Refill  . Apoaequorin (PREVAGEN PO) Take by mouth daily.    . Biotin 10 MG TABS Take 1 tablet daily by mouth.    . Calcium Carb-Cholecalciferol (CALCIUM 600+D) 600-800 MG-UNIT TABS Take 1 tablet daily by mouth.    . gabapentin (NEURONTIN) 100 MG capsule TK 1 Cap PO IN THE MORNING AND 3 Caps PO IN THE EVE UTD  5  . levothyroxine (SYNTHROID, LEVOTHROID) 75 MCG tablet Take 75 mcg by mouth every other day. SUN, Tuesday, Thursday, Sat    . levothyroxine (SYNTHROID, LEVOTHROID) 88 MCG tablet Take 88 mcg by mouth every other day. MON, WED, FRIDAY    . Multiple Vitamins-Minerals  (MULTIVITAMIN WITH MINERALS) tablet Take 1 tablet by mouth daily.    . Multiple Vitamins-Minerals (PRESERVISION AREDS 2) CAPS Take 2 capsules by mouth daily.    Vladimir Faster Glycol-Propyl Glycol (SYSTANE) 0.4-0.3 % SOLN Place 1 drop into both eyes daily as needed (for dry eyes).     No facility-administered medications prior to visit.          Objective:   Physical Exam Vitals:   02/05/19 1504  BP: (!) 150/88  Pulse: 90  SpO2: 98%  Weight: 125 lb (56.7 kg)  Height: 5\' 1"  (1.549 m)   Gen: Pleasant, well-nourished,  in no distress,  normal affect  ENT: No lesions,  mouth clear,  oropharynx clear, no postnasal drip  Neck: No JVD, no stridor  Lungs: No use of accessory muscles, no crackles or wheezing on normal respiration, no wheeze on forced expiration  Cardiovascular: RRR, heart sounds normal, no murmur or gallops, no peripheral edema  Musculoskeletal: No deformities, no cyanosis or clubbing  Neuro: alert, awake, non focal  Skin: Warm, no lesions or rash     Assessment & Plan:  Abnormal chest x-ray Consistent with a left pneumonia, resolved on repeat CT 07/27/2018.  Dyspnea on exertion Exercise testing was reassuring, no evidence of underlying lung disease or heart disease.  We will rule out occult desaturation today but suspect that she is dealing with restriction from her scoliosis and also deconditioning due to her recent immobility.  Reassured her about her cardiac and pulmonary function  Levy Pupa, MD, PhD 02/05/2019, 3:34 PM [facility] [phone] or if no answer [phone]

## 2019-02-11 DIAGNOSIS — H04123 Dry eye syndrome of bilateral lacrimal glands: Secondary | ICD-10-CM | POA: Insufficient documentation

## 2020-03-20 ENCOUNTER — Encounter: Payer: Self-pay | Admitting: Cardiology

## 2020-03-20 ENCOUNTER — Other Ambulatory Visit: Payer: Self-pay

## 2020-03-20 ENCOUNTER — Ambulatory Visit (INDEPENDENT_AMBULATORY_CARE_PROVIDER_SITE_OTHER): Payer: Medicare Other | Admitting: Cardiology

## 2020-03-20 VITALS — BP 148/72 | HR 107 | Ht 62.0 in | Wt 113.6 lb

## 2020-03-20 DIAGNOSIS — R079 Chest pain, unspecified: Secondary | ICD-10-CM

## 2020-03-20 DIAGNOSIS — R072 Precordial pain: Secondary | ICD-10-CM

## 2020-03-20 DIAGNOSIS — E785 Hyperlipidemia, unspecified: Secondary | ICD-10-CM | POA: Diagnosis not present

## 2020-03-20 DIAGNOSIS — R0600 Dyspnea, unspecified: Secondary | ICD-10-CM

## 2020-03-20 DIAGNOSIS — I10 Essential (primary) hypertension: Secondary | ICD-10-CM

## 2020-03-20 DIAGNOSIS — R0609 Other forms of dyspnea: Secondary | ICD-10-CM

## 2020-03-20 DIAGNOSIS — R6889 Other general symptoms and signs: Secondary | ICD-10-CM

## 2020-03-20 DIAGNOSIS — R9431 Abnormal electrocardiogram [ECG] [EKG]: Secondary | ICD-10-CM

## 2020-03-20 MED ORDER — METOPROLOL TARTRATE 50 MG PO TABS: 50.0000 mg | ORAL_TABLET | Freq: Once | ORAL | 0 refills | Status: DC

## 2020-03-20 NOTE — Patient Instructions (Addendum)
Medication Instructions:  See instruction sheet   *If you need a refill on your cardiac medications before your next appointment, please call your pharmacy*   Lab Work:  See instruction sheet   If you have labs (blood work) drawn today and your tests are completely normal, you will receive your results only by: Marland Kitchen MyChart Message (if you have MyChart) OR . A paper copy in the mail If you have any lab test that is abnormal or we need to change your treatment, we will call you to review the results.   Testing/Procedures: Will be schedule at [facility] has requested that you have an echocardiogram. Echocardiography is a painless test that uses sound waves to create images of your heart. It provides your doctor with information about the size and shape of your heart and how well your heart's chambers and valves are working. This procedure takes approximately one hour. There are no restrictions for this procedure.  And Will be schedule at [facility]-- once authorization is obtain from insurance  Your physician has requested that you have cardiac CTA. Cardiac computed tomography (CT) is a painless test that uses an x-ray machine to take clear, detailed pictures of your heart. Please follow instruction sheet as given.     Follow-Up: At [facility], you and your health needs are our priority.  As part of our continuing mission to provide you with exceptional heart care, we have created designated Provider Care Teams.  These Care Teams include your primary Cardiologist (physician) and Advanced Practice Providers (APPs -  Physician Assistants and Nurse Practitioners) who all work together to provide you with the care you need, when you need it.  We recommend signing up for the patient portal called "MyChart".  Sign up information is provided on this After Visit Summary.  MyChart is used to connect with patients for  Virtual Visits (Telemedicine).  Patients are able to view lab/test results, encounter notes, upcoming appointments, etc.  Non-urgent messages can be sent to your provider as well.   To learn more about what you can do with MyChart, go to [email or web address].    Your next appointment:   2 month(s)  The format for your next appointment:   In Person  Provider:   Glenetta Hew, MD   Other Instructions   Your cardiac CT will be scheduled at  the below location:   [facility] [address] [place], [place] [address] [phone]   [facility], please arrive at the [facility] main entrance of [facility] 30 minutes prior to test start time. Proceed to the [facility] Radiology Department (first floor) to check-in and test prep.    Please follow these instructions carefully (unless otherwise directed):  Please have lab-BMP done one week prior to test  On the Night Before the Test: . Be sure to Drink plenty of water. . Do not consume any caffeinated/decaffeinated beverages or chocolate 12 hours prior to your test. . Do not take any antihistamines 12 hours prior to your test.   On the Day of the Test: . Drink plenty of water. Do not drink any water within one hour of the test. . Do not eat any food 4 hours prior to the test. . You may take your regular medications prior to the test.  . Take metoprolol (Lopressor) 50 mg  two hours prior to test. . FEMALES- please wear underwire-free  bra if available        After the Test: . Drink plenty of water. . After receiving IV contrast, you may experience a mild flushed feeling. This is normal. . On occasion, you may experience a mild rash up to 24 hours after the test. This is not dangerous. If this occurs, you can take Benadryl 25 mg and increase your fluid intake. . If you experience trouble breathing, this can be serious. If it is severe call 911 IMMEDIATELY. If it is mild, please call our  office.    Once we have confirmed authorization from your insurance company, we will call you to set up a date and time for your test. Based on how quickly your insurance processes prior authorizations requests, please allow up to 4 weeks to be contacted for scheduling your Cardiac CT appointment. Be advised that routine Cardiac CT appointments could be scheduled as many as 8 weeks after your provider has ordered it.  For non-scheduling related questions, please contact the cardiac imaging nurse navigator should you have any questions/concerns: Marchia Bond, Cardiac Imaging Nurse Navigator Burley Saver, Interim Cardiac Imaging Nurse [facility] Direct Office Dial: [phone]   For scheduling needs, including cancellations and rescheduling, please call Vivien Rota at [phone], option 3.

## 2020-03-20 NOTE — Progress Notes (Signed)
Primary Care Provider: Antony Contras, MD Cardiologist: No primary care provider on file. Electrophysiologist: None  Clinic Note: Chief Complaint  Patient presents with  . New Patient (Initial Visit)    Delayed follow-up, was last seen in 2018, therefore returns as a "new patient ".  . Shortness of Breath    On exertion   HPI:    Summer Davis is a 86 y.o. female with a PMH notable for CORONARY ARTERY CALCIFICATION-(CORONARY CALCIUM SCORE 367-less than 30% calcified proximal LAD but otherwise normal coronaries) who presents today for reestablishment of cardiology care at the request of Summer Contras, MD with concerns for EXERTIONAL DYSPNEA.  Problem List Items Addressed This Visit    Chest pain with moderate risk for cardiac etiology (Chronic)   Dyspnea on exertion (Chronic)   Essential hypertension - Primary (Chronic)   Hyperlipidemia LDL goal <100     Summer Davis was last seen in August 2018 to follow-up after coronary CTA.  She was not having any exertional chest pain or discomfort just exertional dyspnea.  Was recovering from bronchitis/pneumonia. 2D echo ordered  She was seen by Dr. Moreen Davis on October 18.  She continued to note ongoing exertional dyspnea.  She apparently has had pneumonia in the left upper lobe in 2020 that was resolved by CT scan she indicated that she would like to follow back up with cardiology to reassess her this.  Was therefore placed for consult.  Recent Hospitalizations: None  Reviewed  CV studies:    The following studies were reviewed today: (if available, images/films reviewed: From Epic Chart or Care Everywhere) . CPX May 27, 2017: Relatively unremarkable.  Excellent exercise tolerance.  Excellent functional capacity.  No indication for cardiopulmonary exercise or exercise induced asthma.  Toes quite normal conditioning.  Did note some hyperventilation during peak intensity exercise.  Mild hypertensive response. . 2D echo 01/14/2017: EF 55  to 60%.  GR 1 DD.  Aortic valve sclerosis with mild AI.  Mild TR.   Coronary calcium score/coronary CTA: No significant "non-cardiac" findings  Coronary Calcium Score (HIGH) 367. < 30% calcified pLAD. Calcification at base of AoV near the LM.  Interval History:   Summer Davis returns today for cardiology evaluation with complaint of exertional dyspnea.  She says that she gets short of breath pretty much with any activity including vacuuming or going up and down stairs.  She says she wakes up in the middle night symptoms go the bathroom and walking back to the bed to the bathroom short of breath.  Usually, walking on level ground she does okay.  She does does yard work and as long she does not do too much, she does okay.  As soon as he tries to increase her level of activity or go up steps for a hill, she will get short of breath.  She does not carry something short of short of breath.  She also notes occasionally having tightness in her chest associated with dyspnea.  She really notes that this shortness of breath all began dating back to when her husband died in 05/27/14.  She has had spells where he gets worse and spells will get better.  She has had off-and-on fluttering in her chest for the last couple months maybe once or twice here there no prolonged episodes, maybe lasting 1 to 2 minutes.  About 3 weeks ago she had a fall, and she is not exactly sure what happened she does not think she passed out, but is  not sure what happened.   Has previous indicated being intolerant of statins in the past.  Is on Zetia.  She is never really given a diagnosis of hypertension but has had elevated blood pressures.  CV Review of Symptoms (Summary) Cardiovascular ROS: positive for - chest pain, dyspnea on exertion, irregular heartbeat, palpitations and Lightheadedness and dizziness with near syncope negative for - edema, loss of consciousness, orthopnea, paroxysmal nocturnal dyspnea or TIA/amaurosis  fugax or claudication  The patient does not have symptoms concerning for COVID-19 infection (fever, chills, cough, or new shortness of breath).   REVIEWED OF SYSTEMS   Review of Systems  Constitutional: Negative for malaise/fatigue and weight loss.  HENT: Negative for congestion and nosebleeds.   Respiratory: Positive for shortness of breath. Negative for cough.   Cardiovascular:       History of varicose vein with minimal swelling.  Gastrointestinal: Positive for heartburn.       Chronic dyspepsia.  Genitourinary: Positive for frequency (Nocturia). Negative for hematuria.  Musculoskeletal: Positive for back pain (Chronic low back pain-on Neurontin) and joint pain (Bilateral hip pain followed by Summer Davis initially, now seeing Summer Davis).  Neurological: Positive for headaches (Chronic migraine headaches).  Psychiatric/Behavioral: Positive for memory loss. The patient has insomnia.    I have reviewed and (if needed) personally updated the patient's problem list, medications, allergies, past medical and surgical history, social and family history.   PAST MEDICAL HISTORY   Past Medical History:  Diagnosis Date  . Arthritis    Left hip  . Cataracts, bilateral    Followed by Summer Davis & Summer Davis @ [facility] ophthalmology.  . Chronic low back pain   . DOE (dyspnea on exertion)    Evaluated in 2013 with a Myoview that was negative for ischemia; CPX in Jan 2019 - Normal.  Echo 01/2017 - Normal  . Esophageal reflux   . GERD (gastroesophageal reflux disease)   . H/O scoliosis   . Hypercholesterolemia   . Hypothyroidism   . Insomnia   . Lichen sclerosus   . Memory difficulty   . PONV (postoperative nausea and vomiting)   . Tortuous colon   . UTI (lower urinary tract infection)   . Varicose veins of both lower extremities with pain    h/x varicose veins - with endoscopic vein ablation    PAST SURGICAL HISTORY   Past Surgical History:  Procedure Laterality Date  . CARDIOPULMONARY  EXERCISE TEST  05/2017   : Relatively unremarkable.  Excellent exercise tolerance.  Marland Kitchen CATARACT EXTRACTION Bilateral 2016   Left-sided surgery was unsuccessful.  . CHOLECYSTECTOMY  1976  . ENDOSCOPIC VEIN LASER TREATMENT    . EYE MUSCLE SURGERY Left   . FOOT NEUROMA SURGERY Right   . NM MYOVIEW LTD  02/2012   No evidence of ischemia or infarction. Normal ejection fraction.  . TONSILLECTOMY AND ADENOIDECTOMY  1938  . TOTAL HIP ARTHROPLASTY Left 2000   Left  . TOTAL KNEE ARTHROPLASTY Right 11/17/2012   Procedure: RIGHT TOTAL KNEE ARTHROPLASTY;  Surgeon: Mauri Pole, MD;  Location: [facility];  Service: Orthopedics;  Laterality: Right;  . TRANSTHORACIC ECHOCARDIOGRAM  01/14/2017    EF 55 to 60%.  GR 1 DD.  Aortic valve sclerosis with mild AI.  Mild TR.    Immunization History  Administered Date(s) Administered  . Fluad Quad(high Dose 65+) 01/20/2019  . Influenza, High Dose Seasonal PF 02/10/2017, 02/03/2018  . Pneumococcal Polysaccharide-23 05/14/2015    MEDICATIONS/ALLERGIES   Current Meds  Medication Sig  . Apoaequorin (PREVAGEN PO) Take by mouth daily.  . Biotin 10 MG TABS Take 1 tablet daily by mouth.  . Calcium Carb-Cholecalciferol (CALCIUM 600+D) 600-800 MG-UNIT TABS Take 1 tablet daily by mouth.  . clobetasol ointment (TEMOVATE) 0.05 % SMARTSIG:Sparingly Topical PRN  . levothyroxine (SYNTHROID, LEVOTHROID) 75 MCG tablet Take 75 mcg by mouth every other day. SUN, Tuesday, Thursday, Sat  . levothyroxine (SYNTHROID, LEVOTHROID) 88 MCG tablet Take 88 mcg by mouth every other day. MON, WED, FRIDAY  . Multiple Vitamins-Minerals (MULTIVITAMIN WITH MINERALS) tablet Take 1 tablet by mouth daily.  . Multiple Vitamins-Minerals (PRESERVISION AREDS 2) CAPS Take 2 capsules by mouth daily.  Summer Davis Glycol-Propyl Glycol (SYSTANE) 0.4-0.3 % SOLN Place 1 drop into both eyes daily as needed (for dry eyes).    Allergies  Allergen Reactions  . Alendronate Sodium   . Codeine   . Lipitor  [Atorvastatin]   . Oxycodone Nausea And Vomiting  . Zetia [Ezetimibe]   . Zocor [Simvastatin]     SOCIAL HISTORY/FAMILY HISTORY   Reviewed in Epic:  Pertinent findings:  Social History   Tobacco Use  . Smoking status: Never Smoker  . Smokeless tobacco: Never Used  Substance Use Topics  . Alcohol use: No  . Drug use: No   Social History   Social History Narrative   Patient lives at home alone. She is a widow - her husband Morey Hummingbird) died in 01-Jun-2014   High school education.  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   Caffeine- one cup coffee daily.   - She did have prolonged secondhand smoke exposure from her first husband.   One child deceased -> he died of overdose of prescription medications   3 remaining sons aged 36, 60 and 68-> Also one stepchild.    As of 2018, she she had a total of 7 grandchildren of her home with three-step grandchildren. 10+ great-grandchildren.   Family History  Problem Relation Age of Onset  . Heart disease Father        Later onset CAD and heart failure  . Cancer Mother        Liver and pancreas  . Hypertension Sister   . Hyperlipidemia Brother     OBJCTIVE -PE, EKG, labs   Wt Readings from Last 3 Encounters:  03/20/20 113 lb 9.6 oz (51.5 kg)  02/05/19 125 lb (56.7 kg)  07/17/18 114 lb (51.7 kg)    Physical Exam: BP (!) 148/72   Pulse (!) 107   Ht $R'5\' 2"'PR$  (1.575 m)   Wt 113 lb 9.6 oz (51.5 kg)   SpO2 97%   BMI 20.78 kg/m  Physical Exam Vitals reviewed.  Constitutional:      General: She is not in acute distress.    Appearance: She is not ill-appearing or toxic-appearing.     Comments: Somewhat thin and frail elderly woman.  Well-groomed.  HENT:     Head: Normocephalic and atraumatic.  Neck:     Vascular: No carotid bruit, hepatojugular reflux or JVD.  Cardiovascular:     Rate and Rhythm: Regular rhythm. Tachycardia present.     Chest Wall:  PMI is not displaced.     Pulses: Decreased pulses (Diminished but palpable pedal pulses.).     Heart sounds: S1 normal and S2 normal. Murmur heard.  Medium-pitched crescendo-decrescendo early systolic murmur is present with a grade of 1/6 at the upper  right sternal border.  No friction rub. No gallop.   Pulmonary:     Effort: Pulmonary effort is normal. No respiratory distress.     Breath sounds: Normal breath sounds.  Chest:     Chest wall: No tenderness.  Musculoskeletal:        General: Swelling (Trivial) present. Normal range of motion.     Cervical back: Normal range of motion and neck supple.  Neurological:     General: No focal deficit present.     Mental Status: She is alert and oriented to person, place, and time.  Psychiatric:        Mood and Affect: Mood normal.        Behavior: Behavior normal.        Thought Content: Thought content normal.        Judgment: Judgment normal.     Comments: She is not a very great historian.     Adult ECG Report  Rate: 107 ;  Rhythm: sinus tachycardia and Otherwise normal axis, intervals and durations.;   Narrative Interpretation: Relatively stable EKG.  Recent Labs: 02/28/2020  Na+ 131, K+ 5.1, Cl- 96, HCO3-29, BUN 10, Cr 0.66, Glu 106, Ca2+ 9.5; AST 19, ALT 15, AlkP 77  TC 211, TG 99, HDL 67, LDLc 126; TSH 3.48  No results found for: CHOL, HDL, LDLCALC, LDLDIRECT, TRIG, CHOLHDL Lab Results  Component Value Date   CREATININE 0.85 07/24/2018   BUN 19 07/24/2018   NA 139 07/24/2018   K 4.9 07/24/2018   CL 103 07/24/2018   CO2 30 07/24/2018   Lab Results  Component Value Date   TSH 1.97 02/28/2017    ASSESSMENT/PLAN    Problem List Items Addressed This Visit    Chest pain with moderate risk for cardiac etiology (Chronic)    Chest discomfort/tightness associated with dyspnea.  She did have evidence of mild to moderate disease in the LAD back in 2018.  Symptoms seem to be somewhat more progressive.  Would be nice to see  if there is any progression of disease both from a coronary standpoint and also from a valvular and EF standpoint..    Plan:   Recheck Coronary CTA with coronary CT FFR (not previous available)   2D echocardiogram       Relevant Orders   EKG 12-Lead (Completed)   Basic metabolic panel   CT CORONARY MORPH W/CTA COR W/SCORE W/CA W/CM &/OR WO/CM   CT CORONARY FRACTIONAL FLOW RESERVE DATA PREP   CT CORONARY FRACTIONAL FLOW RESERVE FLUID ANALYSIS   ECHOCARDIOGRAM COMPLETE   Dyspnea on exertion (Chronic)    She has been evaluated several times in the past, and has had reassuring results.  In the past evaluations have indicated deconditioning as a reasonable cause.  Plan: Check 2D echocardiogram and Coronary CT Angiogram-CT FFR.      Relevant Orders   EKG 12-Lead (Completed)   Basic metabolic panel   CT CORONARY MORPH W/CTA COR W/SCORE W/CA W/CM &/OR WO/CM   CT CORONARY FRACTIONAL FLOW RESERVE DATA PREP   CT CORONARY FRACTIONAL FLOW RESERVE FLUID ANALYSIS   ECHOCARDIOGRAM COMPLETE   Essential hypertension - Primary (Chronic)    I think she needs arteriogram hypertension.  Not currently on any medications.  Would like to see with the results of her cardiac valuation or before determining the best course of action. Reassess pressures on follow-up.      Hyperlipidemia LDL goal <100 (Chronic)    She is no longer on  Zetia.  Has indicated that she was statin intolerant in the past, now Zetia is also listed as a "allergies "along with atorvastatin and simvastatin.  We will reassess her coronary arteries with coronary calcium score and CTA.  The fact that she has not been treated for the last 4 years with me it is possible that this is definitely progressed.  The question will be what we do if we find something.  She has not tolerated appropriate medications in the past.  Not sure what the issue with Zetia was, perhaps we could try pravastatin or rosuvastatin versus potential Nexletol.       Exercise intolerance    She has been evaluated with multiple different techniques.  If it were not for the restrictions with COVID-19 making treadmill exercising a difficult situation and use of mass, GXT or other CPX would be reasonable, however at this point I think an anatomic and physiologic evaluation of her coronary arteries as well as cardiac function is the best option.  Plan: Coronary CT angiogram (2D echo       Other Visit Diagnoses    Precordial pain       Relevant Orders   CT CORONARY FRACTIONAL FLOW RESERVE DATA PREP   CT CORONARY FRACTIONAL FLOW RESERVE FLUID ANALYSIS   Abnormal electrocardiogram (ECG) (EKG)        Relevant Orders   CT CORONARY MORPH W/CTA COR W/SCORE W/CA W/CM &/OR WO/CM   CT CORONARY FRACTIONAL FLOW RESERVE DATA PREP   CT CORONARY FRACTIONAL FLOW RESERVE FLUID ANALYSIS   ECHOCARDIOGRAM COMPLETE       COVID-19 Education: The signs and symptoms of COVID-19 were discussed with the patient and how to seek care for testing (follow up with PCP or arrange E-visit).   The importance of social distancing and COVID-19 vaccination was discussed today. 1 min The patient is practicing social distancing & Masking.   I spent a total of 58minutes with the patient spent in direct patient consultation.  Additional time spent with chart review  / charting (studies, outside notes, etc): 10 Total Time: 45 min   Current medicines are reviewed at length with the patient today.  (+/- concerns) n/a  This visit occurred during the SARS-CoV-2 public health emergency.  Safety protocols were in place, including screening questions prior to the visit, additional usage of staff PPE, and extensive cleaning of exam room while observing appropriate contact time as indicated for disinfecting solutions.  Notice: This dictation was prepared with Dragon dictation along with smaller phrase technology. Any transcriptional errors that result from this process are unintentional and may not  be corrected upon review.  Patient Instructions / Medication Changes & Studies & Tests Ordered   Patient Instructions  Medication Instructions:  See instruction sheet   *If you need a refill on your cardiac medications before your next appointment, please call your pharmacy*   Lab Work:  See instruction sheet   If you have labs (blood work) drawn today and your tests are completely normal, you will receive your results only by: Marland Kitchen MyChart Message (if you have MyChart) OR . A paper copy in the mail If you have any lab test that is abnormal or we need to change your treatment, we will call you to review the results.   Testing/Procedures: Will be schedule at [facility] has requested that you have an echocardiogram. Echocardiography is a painless test that uses sound waves to create images of your heart.  It provides your doctor with information about the size and shape of your heart and how well your heart's chambers and valves are working. This procedure takes approximately one hour. There are no restrictions for this procedure.  And Will be schedule at [address]  [facility] - Radiology-- once authorization is obtain from insurance  Your physician has requested that you have cardiac CTA. Cardiac computed tomography (CT) is a painless test that uses an x-ray machine to take clear, detailed pictures of your heart. Please follow instruction sheet as given.     Follow-Up: At [facility], you and your health needs are our priority.  As part of our continuing mission to provide you with exceptional heart care, we have created designated Provider Care Teams.  These Care Teams include your primary Cardiologist (physician) and Advanced Practice Providers (APPs -  Physician Assistants and Nurse Practitioners) who all work together to provide you with the care you need, when you need it.  We recommend signing up for the patient portal  called "MyChart".  Sign up information is provided on this After Visit Summary.  MyChart is used to connect with patients for Virtual Visits (Telemedicine).  Patients are able to view lab/test results, encounter notes, upcoming appointments, etc.  Non-urgent messages can be sent to your provider as well.   To learn more about what you can do with MyChart, go to [email or web address].    Your next appointment:   2 month(s)  The format for your next appointment:   In Person  Provider:   Bryan Lemma, MD   Other Instructions   Your cardiac CT will be scheduled at  the below location:   [facility] [address] [place], [place] [address] [phone]   [facility], please arrive at the [facility] main entrance of [facility] 30 minutes prior to test start time. Proceed to the [facility] Radiology Department (first floor) to check-in and test prep.    Please follow these instructions carefully (unless otherwise directed):  Please have lab-BMP done one week prior to test  On the Night Before the Test: . Be sure to Drink plenty of water. . Do not consume any caffeinated/decaffeinated beverages or chocolate 12 hours prior to your test. . Do not take any antihistamines 12 hours prior to your test.   On the Day of the Test: . Drink plenty of water. Do not drink any water within one hour of the test. . Do not eat any food 4 hours prior to the test. . You may take your regular medications prior to the test.  . Take metoprolol (Lopressor) 50 mg  two hours prior to test. . FEMALES- please wear underwire-free bra if available        After the Test: . Drink plenty of water. . After receiving IV contrast, you may experience a mild flushed feeling. This is normal. . On occasion, you may experience a mild rash up to 24 hours after the test. This is not dangerous. If this occurs, you can take Benadryl 25 mg and increase your fluid intake. . If you  experience trouble breathing, this can be serious. If it is severe call 911 IMMEDIATELY. If it is mild, please call our office.    Once we have confirmed authorization from your insurance company, we will call you to set up a date and time for your test. Based on how quickly your insurance processes prior authorizations requests, please allow up  to 4 weeks to be contacted for scheduling your Cardiac CT appointment. Be advised that routine Cardiac CT appointments could be scheduled as many as 8 weeks after your provider has ordered it.  For non-scheduling related questions, please contact the cardiac imaging nurse navigator should you have any questions/concerns: Marchia Bond, Cardiac Imaging Nurse Navigator Burley Saver, Interim Cardiac Imaging Nurse [facility] [facility] Direct Office Dial: [phone]   For scheduling needs, including cancellations and rescheduling, please call Vivien Rota at [phone], option 3.        Studies Ordered:   Orders Placed This Encounter  Procedures  . CT CORONARY MORPH W/CTA COR W/SCORE W/CA W/CM &/OR WO/CM  . CT CORONARY FRACTIONAL FLOW RESERVE DATA PREP  . CT CORONARY FRACTIONAL FLOW RESERVE FLUID ANALYSIS  . Basic metabolic panel  . EKG 12-Lead  . ECHOCARDIOGRAM COMPLETE     Glenetta Hew, M.D., M.S. Interventional Cardiologist   Pager # [phone] Phone # [phone] [address] [place], [place] [address]   Thank you for choosing Heartcare at [facility]!!

## 2020-03-23 ENCOUNTER — Encounter: Payer: Self-pay | Admitting: Cardiology

## 2020-03-23 NOTE — Assessment & Plan Note (Signed)
I think she needs arteriogram hypertension.  Not currently on any medications.  Would like to see with the results of her cardiac valuation or before determining the best course of action. Reassess pressures on follow-up.

## 2020-03-23 NOTE — Assessment & Plan Note (Signed)
She has been evaluated several times in the past, and has had reassuring results.  In the past evaluations have indicated deconditioning as a reasonable cause.  Plan: Check 2D echocardiogram and Coronary CT Angiogram-CT FFR.

## 2020-03-23 NOTE — Assessment & Plan Note (Signed)
She is no longer on Zetia.  Has indicated that she was statin intolerant in the past, now Zetia is also listed as a "allergies "along with atorvastatin and simvastatin.  We will reassess her coronary arteries with coronary calcium score and CTA.  The fact that she has not been treated for the last 4 years with me it is possible that this is definitely progressed.  The question will be what we do if we find something.  She has not tolerated appropriate medications in the past.  Not sure what the issue with Zetia was, perhaps we could try pravastatin or rosuvastatin versus potential Nexletol.

## 2020-03-23 NOTE — Assessment & Plan Note (Signed)
Chest discomfort/tightness associated with dyspnea.  She did have evidence of mild to moderate disease in the LAD back in 2018.  Symptoms seem to be somewhat more progressive.  Would be nice to see if there is any progression of disease both from a coronary standpoint and also from a valvular and EF standpoint..    Plan:   Recheck Coronary CTA with coronary CT FFR (not previous available)   2D echocardiogram

## 2020-03-23 NOTE — Assessment & Plan Note (Signed)
She has been evaluated with multiple different techniques.  If it were not for the restrictions with COVID-19 making treadmill exercising a difficult situation and use of mass, GXT or other CPX would be reasonable, however at this point I think an anatomic and physiologic evaluation of her coronary arteries as well as cardiac function is the best option.  Plan: Coronary CT angiogram (2D echo

## 2020-04-11 LAB — BASIC METABOLIC PANEL
BUN/Creatinine Ratio: 14 (ref 12–28)
BUN: 9 mg/dL (ref 8–27)
CO2: 26 mmol/L (ref 20–29)
Calcium: 9.3 mg/dL (ref 8.7–10.3)
Chloride: 98 mmol/L (ref 96–106)
Creatinine, Ser: 0.63 mg/dL (ref 0.57–1.00)
GFR calc Af Amer: 93 mL/min/{1.73_m2} (ref >59)
GFR calc non Af Amer: 81 mL/min/{1.73_m2} (ref >59)
Glucose: 105 mg/dL — ABNORMAL HIGH (ref 65–99)
Potassium: 4.9 mmol/L (ref 3.5–5.2)
Sodium: 137 mmol/L (ref 134–144)

## 2020-04-13 ENCOUNTER — Other Ambulatory Visit: Payer: Self-pay

## 2020-04-13 ENCOUNTER — Ambulatory Visit (HOSPITAL_COMMUNITY): Payer: Medicare Other | Attending: Cardiovascular Disease

## 2020-04-13 DIAGNOSIS — R0600 Dyspnea, unspecified: Secondary | ICD-10-CM | POA: Diagnosis not present

## 2020-04-13 DIAGNOSIS — R9431 Abnormal electrocardiogram [ECG] [EKG]: Secondary | ICD-10-CM

## 2020-04-13 DIAGNOSIS — R079 Chest pain, unspecified: Secondary | ICD-10-CM | POA: Diagnosis not present

## 2020-04-13 DIAGNOSIS — R0609 Other forms of dyspnea: Secondary | ICD-10-CM

## 2020-04-13 LAB — ECHOCARDIOGRAM COMPLETE
AR max vel: 1.77 cm2
AV Area VTI: 1.65 cm2
AV Area mean vel: 1.55 cm2
AV Mean grad: 8.3 mmHg
AV Peak grad: 14.1 mmHg
Ao pk vel: 1.88 m/s
Area-P 1/2: 3.27 cm2
P 1/2 time: 334 msec
S' Lateral: 2.4 cm

## 2020-04-14 ENCOUNTER — Telehealth (HOSPITAL_COMMUNITY): Payer: Self-pay | Admitting: Emergency Medicine

## 2020-04-14 NOTE — Telephone Encounter (Signed)
Attempted to call patient regarding upcoming cardiac CT appointment. Left message on voicemail with name and callback number Rockwell Alexandria RN Navigator Cardiac Imaging [facility] [phone] Office [phone] Cell   Called patients son Hyacinth Meeker) who I reviewed CTA instructions with. Son verbalized understanding. Appreciated the call.  Call back number provided in the case of further questions. Huntley Dec

## 2020-04-17 ENCOUNTER — Ambulatory Visit (HOSPITAL_COMMUNITY)
Admission: RE | Admit: 2020-04-17 | Discharge: 2020-04-17 | Disposition: A | Discharge status: Home / Self Care | Payer: Medicare Other | Source: Ambulatory Visit | Attending: Cardiology | Admitting: Cardiology

## 2020-04-17 ENCOUNTER — Other Ambulatory Visit: Payer: Self-pay

## 2020-04-17 DIAGNOSIS — R079 Chest pain, unspecified: Secondary | ICD-10-CM | POA: Diagnosis present

## 2020-04-17 DIAGNOSIS — R0600 Dyspnea, unspecified: Secondary | ICD-10-CM

## 2020-04-17 DIAGNOSIS — R9431 Abnormal electrocardiogram [ECG] [EKG]: Secondary | ICD-10-CM

## 2020-04-17 DIAGNOSIS — R0609 Other forms of dyspnea: Secondary | ICD-10-CM

## 2020-04-17 MED ORDER — NITROGLYCERIN 0.4 MG SL SUBL
0.8000 mg | SUBLINGUAL_TABLET | Freq: Once | SUBLINGUAL | Status: AC
  Administered 2020-04-17: 0.8 mg via SUBLINGUAL

## 2020-04-17 MED ORDER — NITROGLYCERIN 0.4 MG SL SUBL
SUBLINGUAL_TABLET | SUBLINGUAL | Status: AC
  Filled 2020-04-17: qty 2

## 2020-04-17 MED ORDER — IOHEXOL 350 MG/ML SOLN
80.0000 mL | Freq: Once | INTRAVENOUS | Status: AC | PRN
  Administered 2020-04-17: 80 mL via INTRAVENOUS

## 2020-04-19 ENCOUNTER — Ambulatory Visit (HOSPITAL_COMMUNITY)
Admission: RE | Admit: 2020-04-19 | Discharge: 2020-04-19 | Disposition: A | Discharge status: Home / Self Care | Payer: Medicare Other | Source: Ambulatory Visit | Attending: Cardiology | Admitting: Cardiology

## 2020-04-19 DIAGNOSIS — R0609 Other forms of dyspnea: Secondary | ICD-10-CM

## 2020-04-19 DIAGNOSIS — R9430 Abnormal result of cardiovascular function study, unspecified: Secondary | ICD-10-CM | POA: Diagnosis not present

## 2020-04-19 DIAGNOSIS — R9431 Abnormal electrocardiogram [ECG] [EKG]: Secondary | ICD-10-CM | POA: Insufficient documentation

## 2020-04-19 DIAGNOSIS — R072 Precordial pain: Secondary | ICD-10-CM | POA: Insufficient documentation

## 2020-04-19 DIAGNOSIS — R079 Chest pain, unspecified: Secondary | ICD-10-CM | POA: Diagnosis present

## 2020-04-19 DIAGNOSIS — R0600 Dyspnea, unspecified: Secondary | ICD-10-CM | POA: Diagnosis not present

## 2020-04-22 NOTE — Progress Notes (Signed)
Coronary CT angiogram results: Elevated coronary calcium score 528 which does indeed indicate that there is coronary disease. There does appear to be some mixed plaque in the left main coronary artery that was difficult to tell.  This was evaluated offline with Fractional Flow Reserve (FFR) evaluation indicating no obstructive flow..  There is mild plaque noted in the left anterior descending artery-also nonobstructive by FFR   1. Left Main: 0.97 --abnormal would be <0.85 for Left Main (this indicates that the visualized plaque IS NOT FLOW LIMITING & WOULD NOT CAUSE SHORTNESS OF BREATH/ CHEST PAIN.   For other arteries, the cutoff is  < 0.80 -> this would indicate NO flow limiting Atherosclerotic Disease noted!!  2. LAD: 0.90. 3. LCX: 0.96. 4. RCA: 0.92.  IMPRESSION: CARDIAC CT Angiogram & CT FFR analysis didn't show any significant stenosis. Medical management for moderate non-obstructive CAD is recommended

## 2020-05-24 ENCOUNTER — Encounter: Payer: Self-pay | Admitting: Cardiology

## 2020-05-24 ENCOUNTER — Ambulatory Visit (INDEPENDENT_AMBULATORY_CARE_PROVIDER_SITE_OTHER): Payer: Medicare Other | Admitting: Cardiology

## 2020-05-24 ENCOUNTER — Other Ambulatory Visit: Payer: Self-pay

## 2020-05-24 VITALS — BP 128/80 | HR 92 | Ht 62.0 in | Wt 113.8 lb

## 2020-05-24 DIAGNOSIS — M79604 Pain in right leg: Secondary | ICD-10-CM

## 2020-05-24 DIAGNOSIS — R079 Chest pain, unspecified: Secondary | ICD-10-CM

## 2020-05-24 DIAGNOSIS — E785 Hyperlipidemia, unspecified: Secondary | ICD-10-CM

## 2020-05-24 DIAGNOSIS — M79605 Pain in left leg: Secondary | ICD-10-CM | POA: Diagnosis not present

## 2020-05-24 DIAGNOSIS — R0600 Dyspnea, unspecified: Secondary | ICD-10-CM | POA: Diagnosis not present

## 2020-05-24 DIAGNOSIS — I10 Essential (primary) hypertension: Secondary | ICD-10-CM

## 2020-05-24 DIAGNOSIS — R0609 Other forms of dyspnea: Secondary | ICD-10-CM

## 2020-05-24 DIAGNOSIS — I350 Nonrheumatic aortic (valve) stenosis: Secondary | ICD-10-CM

## 2020-05-24 NOTE — Progress Notes (Signed)
Primary Care Provider: Tally Joe, MD Cardiologist: No primary care provider on file. Electrophysiologist: None  Clinic Note: Chief Complaint  Patient presents with  . Shortness of Breath    Chronic exertional dyspnea  . Follow-up    Test Results: Echocardiogram and Coronary CTA  = Problem List Items Addressed This Visit    Chest pain with normal risk for cardiac etiology (Chronic)   Dyspnea on exertion (Chronic)-primary   Essential hypertension - Primary (Chronic)   Hyperlipidemia LDL goal <100     HPI:    Summer Davis is a 87 y.o. female with a PMH notable for CORONARY ARTERY CALCIFICATION-(CORONARY CALCIUM SCORE 367-less than 30% calcified proximal LAD but otherwise normal coronaries) who presents today for follow-up evaluation-recently referred by of Tally Joe, MD with concerns for EXERTIONAL DYSPNEA.  Summer Davis was initially seen in August 2018 to follow-up after coronary CTA.  She was not having any exertional chest pain or discomfort just exertional dyspnea.  Was recovering from bronchitis/pneumonia.  She has been evaluated with 2D echocardiogram, coronary CTA and CPX (reviewed below)  Symptoms thought to be related to left upper lobe pneumonia.  She referred back for reconsultation in the fall 2021 after being seen by Dr. Azucena Cecil on October 18 with continued complaints of ongoing exertional dyspnea.  I saw her on November 11 and she continued to complain of exertional dyspnea with despite any activity including vacuuming as well as walking up the stairs.  She says at baseline she is able to walk on level ground without any symptoms, however she then said that she can get short of breath walking to the bathroom in the night.  She also says that occasionally she will have some tightness in the chest with the dyspnea.--She indicates that all of the symptoms began when her husband died in 06-13-14.  She has off-and-on good and bad spells.  She also noted  some fluttering in her chest that can last 1 to couple times a month.  She had a fall and 3 weeks prior to the visit, but thinks that she just lost her balance. --> I decided to recheck a coronary CTA and echocardiogram.  Recent Hospitalizations: None  Reviewed  CV studies:    The following studies were reviewed today: (if available, images/films reviewed: From Epic Chart or Care Everywhere) . 2D Echo (04/13/2020): EF 60 to 70%.  Mild LVH with GR 1 DD.  Trivial small pericardial effusion, mild aortic valve stenosis with mean gradient of 8.3 mmHg . Coronary CTA-Calcium Score (04/17/2020): Calcium score 528, moderate stenosis of CAD-RADS 3.  CT FFR evaluation of Left Main disease was 0.97.  LAD was 0.90, LCx 0.96 and RCA 0.92.  (All nonsignificant nonobstructive).  Mild calcified plaque in the proximal LAD 25 and 50%.  Aortic atherosclerosis noted.  Interval History:   Summer Davis returns today for cardiology follow-up after reevaluation.  She indicates that she still has shortness of breath and exertional dyspnea.  She will occasionally also have a rare heavy sensation in her chest.  She says she has more spells that happen about 1 or 2 times a week.  However she is doing little better usually be able to walk maybe 2 mi. now.  But at a very slow rate.  She indicates that she is bothered more now by the fact that her legs hurt with exertion.  No resting symptoms of chest discomfort, leg discomfort or dyspnea. Short heart fluttering spells off and on  lasting a few minutes 1 times a week.  Not overly bothersome.  CV Review of Symptoms (Summary) Cardiovascular ROS: positive for - chest pain, dyspnea on exertion, irregular heartbeat, palpitations, rapid heart rate and Occasional dizziness with near syncope and lightheadedness, but more rare.  Also notes leg pain with exertion. negative for - edema, loss of consciousness, orthopnea, paroxysmal nocturnal dyspnea, shortness of breath or True  syncope or TIA/amaurosis fugax symptoms.  The patient does not have symptoms concerning for COVID-19 infection (fever, chills, cough, or new shortness of breath).   REVIEWED OF SYSTEMS   Review of Systems  Constitutional: Negative for malaise/fatigue and weight loss.  HENT: Negative for congestion and nosebleeds.   Respiratory: Positive for shortness of breath. Negative for cough.   Cardiovascular:       History of varicose vein with minimal swelling.  Gastrointestinal: Positive for heartburn. Negative for abdominal pain, blood in stool, constipation and melena.       Chronic dyspepsia.  Genitourinary: Positive for frequency (Nocturia). Negative for hematuria.  Musculoskeletal: Positive for back pain (Chronic low back pain-on Neurontin), joint pain (Bilateral hip pain followed by Dr. August Saucer initially, now seeing Dr. Charlann Boxer) and myalgias (Legs hurt with walking).  Neurological: Positive for dizziness, weakness (Legs get weak) and headaches (Chronic migraine headaches).  Psychiatric/Behavioral: Positive for memory loss. The patient is nervous/anxious and has insomnia.    I have reviewed and (if needed) personally updated the patient's problem list, medications, allergies, past medical and surgical history, social and family history.   PAST MEDICAL HISTORY   Past Medical History:  Diagnosis Date  . Arthritis    Left hip  . Cataracts, bilateral    Followed by Dr. Gwendalyn Ege & Dr. Rudene Christians @ [facility] ophthalmology.  . Chronic low back pain   . DOE (dyspnea on exertion) 2013   A) 2013-nonischemic Myoview; January 2019-CPX  - Normal.  Echo 01/2017 - Normal  . Esophageal reflux   . GERD (gastroesophageal reflux disease)   . H/O scoliosis   . Hypercholesterolemia   . Hypothyroidism   . Insomnia   . Lichen sclerosus   . Memory difficulty   . Mild aortic stenosis by prior echocardiogram    Echo 04/13/2020: Mild aortic valve stenosis with mean gradient of 8.3 mmHg  . PONV (postoperative nausea and  vomiting)   . Tortuous colon   . UTI (lower urinary tract infection)   . Varicose veins of both lower extremities with pain    h/x varicose veins - with endoscopic vein ablation    PAST SURGICAL HISTORY   Past Surgical History:  Procedure Laterality Date  . CARDIOPULMONARY EXERCISE TEST  05/2017   : Relatively unremarkable.  Excellent exercise tolerance.  Marland Kitchen CATARACT EXTRACTION Bilateral 2016   Left-sided surgery was unsuccessful.  . CHOLECYSTECTOMY  1976  . CORONARY CALCIUM SCORE  04/17/2020   Calcium score 528, moderate stenosis of CAD-RADS 3.  CT FFR evaluation of Left Main disease was 0.97.  LAD was 0.90, LCx 0.96 and RCA 0.92.  (All nonsignificant nonobstructive).  Mild calcified plaque in the proximal LAD 25 and 50%.  Aortic atherosclerosis noted.  . CORONARY CT ANGIOGRAM  11/20/2016   Coronary Calcium Score 367 (HIGH). <30% calcified proximal LAD, otherwise no significant disease.  Calcification of the Aortic Valve base near Left Main.  No significant noncardiac findings  . ENDOSCOPIC VEIN LASER TREATMENT    . EYE MUSCLE SURGERY Left   . FOOT NEUROMA SURGERY Right   . NM MYOVIEW LTD  02/2012   No evidence of ischemia or infarction. Normal ejection fraction.  . TONSILLECTOMY AND ADENOIDECTOMY  1938  . TOTAL HIP ARTHROPLASTY Left 2000   Left  . TOTAL KNEE ARTHROPLASTY Right 11/17/2012   Procedure: RIGHT TOTAL KNEE ARTHROPLASTY;  Surgeon: [name], MD;  Location: [facility];  Service: Orthopedics;  Laterality: Right;  . TRANSTHORACIC ECHOCARDIOGRAM  01/14/2017    EF 55 to 60%.  GR 1 DD.  Aortic valve sclerosis with mild AI.  Mild TR.  Marland Kitchen TRANSTHORACIC ECHOCARDIOGRAM  04/13/2020   EF 60 to 70%.  Mild LVH with GR 1 DD.  Trivial small pericardial effusion, mild aortic valve stenosis with mean gradient of 8.3 mmHg    Immunization History  Administered Date(s) Administered  . Fluad Quad(high Dose 65+) 01/20/2019  . Influenza, High Dose Seasonal PF 02/10/2017, 02/03/2018  .  Pneumococcal Polysaccharide-23 05/14/2015    MEDICATIONS/ALLERGIES   Current Meds  Medication Sig  . Apoaequorin (PREVAGEN PO) Take by mouth daily.  . Biotin 10 MG TABS Take 1 tablet daily by mouth.  . Calcium Carb-Cholecalciferol 600-800 MG-UNIT TABS Take 1 tablet daily by mouth.  . clobetasol ointment (TEMOVATE) 0.05 % SMARTSIG:Sparingly Topical PRN  . gabapentin (NEURONTIN) 100 MG capsule TK 1 Cap PO IN THE MORNING AND 3 Caps PO IN THE EVE UTD  . levothyroxine (SYNTHROID, LEVOTHROID) 75 MCG tablet Take 75 mcg by mouth every other day. SUN, Tuesday, Thursday, Sat  . levothyroxine (SYNTHROID, LEVOTHROID) 88 MCG tablet Take 88 mcg by mouth every other day. MON, WED, FRIDAY  . Multiple Vitamins-Minerals (MULTIVITAMIN WITH MINERALS) tablet Take 1 tablet by mouth daily.  . Multiple Vitamins-Minerals (PRESERVISION AREDS 2) CAPS Take 2 capsules by mouth daily.  Bertram Gala Glycol-Propyl Glycol 0.4-0.3 % SOLN Place 1 drop into both eyes daily as needed (for dry eyes).    Allergies  Allergen Reactions  . Alendronate Sodium   . Codeine   . Lipitor [Atorvastatin]   . Oxycodone Nausea And Vomiting  . Zetia [Ezetimibe]   . Zocor [Simvastatin]     SOCIAL HISTORY/FAMILY HISTORY   Reviewed in Epic:  Pertinent findings:  Social History   Tobacco Use  . Smoking status: Never Smoker  . Smokeless tobacco: Never Used  Substance Use Topics  . Alcohol use: No  . Drug use: No   Social History   Social History Narrative   Patient lives at home alone. She is a widow - her husband Tempie Donning) died in 2014/06/03  High school education.  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   Caffeine- one cup coffee daily.   - She did have prolonged secondhand smoke exposure from her first husband.   One child deceased -> he died of overdose of prescription medications   3 remaining sons aged 85, 41 and 68-> Also one stepchild.     As of 2018, she she had a total of 7 grandchildren of her home with three-step grandchildren. 10+ great-grandchildren.   Family History  Problem Relation Age of Onset  . Heart disease Father        Later onset CAD and heart failure  . Cancer Mother        Liver and pancreas  . Hypertension Sister   . Hyperlipidemia Brother     OBJCTIVE -PE, EKG, labs   Wt Readings from Last 3 Encounters:  05/24/20 113 lb 12.8 oz (51.6 kg)  03/20/20  113 lb 9.6 oz (51.5 kg)  02/05/19 125 lb (56.7 kg)    Physical Exam: BP 128/80 (BP Location: Right Arm, Patient Position: Sitting)   Pulse 92   Ht 5\' 2"  (1.575 m)   Wt 113 lb 12.8 oz (51.6 kg)   SpO2 96%   BMI 20.81 kg/m  Physical Exam Vitals reviewed.  Constitutional:      General: She is not in acute distress.    Appearance: She is not ill-appearing or toxic-appearing.     Comments: Somewhat thin and frail elderly woman.  Well-groomed.  HENT:     Head: Normocephalic and atraumatic.  Neck:     Vascular: No carotid bruit, hepatojugular reflux or JVD.  Cardiovascular:     Rate and Rhythm: Regular rhythm. Tachycardia present.     Chest Wall: PMI is not displaced.     Pulses: Decreased pulses (Weak, but palpable pedal pulses.).     Heart sounds: S1 normal and S2 normal. Heart sounds are distant. Murmur heard.   Medium-pitched crescendo-decrescendo early systolic murmur is present with a grade of 1/6 at the upper right sternal border radiating to the neck. No friction rub. No gallop.   Pulmonary:     Effort: Pulmonary effort is normal. No respiratory distress.     Breath sounds: Normal breath sounds.  Chest:     Chest wall: No tenderness.  Musculoskeletal:        General: Swelling (Trivial) present. Normal range of motion.     Cervical back: Normal range of motion and neck supple.  Neurological:     General: No focal deficit present.     Mental Status: She is alert and oriented to person, place, and time. Mental status is at baseline.   Psychiatric:        Mood and Affect: Mood normal.        Behavior: Behavior normal.        Thought Content: Thought content normal.        Judgment: Judgment normal.     Comments: She is not a very great historian.     Adult ECG Report Not checked  Recent Labs: 02/28/2020  Na+ 131, K+ 5.1, Cl- 96, HCO3-29, BUN 10, Cr 0.66, Glu 106, Ca2+ 9.5; AST 19, ALT 15, AlkP 77  TC 211, TG 99, HDL 67, LDLc 126; TSH 3.48  No results found for: CHOL, HDL, LDLCALC, LDLDIRECT, TRIG, CHOLHDL Lab Results  Component Value Date   CREATININE 0.63 04/10/2020   BUN 9 04/10/2020   NA 137 04/10/2020   K 4.9 04/10/2020   CL 98 04/10/2020   CO2 26 04/10/2020   Lab Results  Component Value Date   TSH 1.97 02/28/2017    ASSESSMENT/PLAN    Problem List Items Addressed This Visit    Chest pain with low risk for cardiac etiology (Chronic)    Chest tightness associated with dyspnea clearly not related to microvascular CAD based on coronary CTA results.  FFR results were extremely normal which would even argue against microvascular disease.  I would not continue to evaluate this from cardiac standpoint based on recent studies.  No further cardiology evaluation needed.      Dyspnea on exertion (Chronic)    Chronic complaint that has been now evaluated with 2 echocardiograms, 2 coronary CT angiograms and a CPX all of which strongly suggest that the symptoms are noncardiac in nature.  I suspect this is probably multifactorial, certainly could be some microvascular component.  I simply recommended that  she continue to try to exercise with reassurance that she is not having anginal equivalent dyspnea from a macrovascular disease.      Essential hypertension (Chronic)    Blood pressure looks good.  She is not on any medications at this point.  With only grade 1 diastolic dysfunction and mild LVH on echo, not unreasonable to simply monitor.  We will try to avoid hypotension.      Hyperlipidemia LDL  goal <100 (Chronic)    He does not get a coronary calcium score therefore 1 would want her LDL level to be actually less than 70, however she did not want take Zetia, and was intolerant of statins.  She is not interested in taking additional medications.  As such, we will simply monitor.  LDL is 126.  Consider discussing Red Yeast Rice.  Given her advanced age and consistent nonobstructive CAD on coronary CTA, probably not unreasonable just to simply monitor with diet and exercise and potentially red yeast rice.  If she would be agreeable, could consider bempedoic acid/Nexletol.  Will defer to PCP.      Mild aortic stenosis by prior echocardiography (Chronic)    Echocardiogram shows mean gradient of 8.5 mmHg which is very mild.  Not likely to cause any symptoms.  I do not think we need to follow-up any further.      Bilateral leg pain - Primary    Not really sure what to make of her leg pain.  This is a new complaint to me with exertional leg pain. Plan: Check ABIs and possible Dopplers.  If abnormal, we will refer to one of our vascular cardiologist.      Relevant Orders   VAS US LOWER EXTREMITY ARTERIAL DUPLEX   VAS US LOWER EXT ART SEG MULTI (SEGMENTALS & LE RAYNAUDS)       COVID-19 Education: The signs and symptoms of COVID-19 were discussed with the patient and how to seek care for testing (follow up with PCP or arrange E-visit).   The importance of social distancing and COVID-19 vaccination was discussed today. 1 min The patient is practicing social distancing & Masking.   I spent a total of 33minutes with the patient spent in direct patient consultation.  Additional time spent with chart review  / charting (studies, outside notes, etc): 13 Total Time: 46 min   Current medicines are reviewed at length with the patient today.  (+/- concerns) n/a  This visit occurred during the SARS-CoV-2 public health emergency.  Safety protocols were in place, including screening questions  prior to the visit, additional usage of staff PPE, and extensive cleaning of exam room while observing appropriate contact time as indicated for disinfecting solutions.  Notice: This dictation was prepared with Dragon dictation along with smaller phrase technology. Any transcriptional errors that result from this process are unintentional and may not be corrected upon review.  Patient Instructions / Medication Changes & Studies & Tests Ordered   Patient Instructions  Medication Instructions:  No changes  *If you need a refill on your cardiac medications before your next appointment, please call your pharmacy*   Lab Work: Not needed   Testing/Procedures:  will be schedule at [address] Your physician has requested that you have an ankle brachial index (ABI). During this test an ultrasound and blood pressure cuff are used to evaluate the arteries that supply the arms and legs with blood. Allow thirty minutes for this exam. There are no restrictions or special instructions.  If above test is positive, will schedule -Your physician has requested that you have a lower extremity arterial duplex. This test is an ultrasound of the arteries in the legs. It looks at arterial blood flow in the legs. Allow one hour for Lower Arterial scans. There are no restrictions or special instructions     Follow-Up: At [facility], you and your health needs are our priority.  As part of our continuing mission to provide you with exceptional heart care, we have created designated Provider Care Teams.  These Care Teams include your primary Cardiologist (physician) and Advanced Practice Providers (APPs -  Physician Assistants and Nurse Practitioners) who all work together to provide you with the care you need, when you need it.  We recommend signing up for the patient portal called "MyChart".  Sign up information is provided on this After Visit Summary.  MyChart is used to connect with patients  for Virtual Visits (Telemedicine).  Patients are able to view lab/test results, encounter notes, upcoming appointments, etc.  Non-urgent messages can be sent to your provider as well.   To learn more about what you can do with MyChart, go to [email or web address].    Your next appointment:    as needed  The format for your next appointment:   In Person  Provider:   Bryan Lemmaavid Armand Preast, MD     Studies Ordered:   Orders Placed This Encounter  Procedures  . VAS US LOWER EXTREMITY ARTERIAL DUPLEX  . VAS US LOWER EXT ART SEG MULTI (SEGMENTALS & LE RAYNAUDS)     Bryan Lemmaavid Hebah Bogosian, M.D., M.S. Interventional Cardiologist   Pager # [phone] Phone # [phone] [address] [place], [place] [address]   Thank you for choosing [facility] at [facility]!!

## 2020-05-24 NOTE — Patient Instructions (Signed)
Medication Instructions:  No changes  *If you need a refill on your cardiac medications before your next appointment, please call your pharmacy*   Lab Work: Not needed   Testing/Procedures:  will be schedule at [address] Your physician has requested that you have an ankle brachial index (ABI). During this test an ultrasound and blood pressure cuff are used to evaluate the arteries that supply the arms and legs with blood. Allow thirty minutes for this exam. There are no restrictions or special instructions.  If above test is positive, will schedule -Your physician has requested that you have a lower extremity arterial duplex. This test is an ultrasound of the arteries in the legs. It looks at arterial blood flow in the legs. Allow one hour for Lower Arterial scans. There are no restrictions or special instructions     Follow-Up: At [facility], you and your health needs are our priority.  As part of our continuing mission to provide you with exceptional heart care, we have created designated Provider Care Teams.  These Care Teams include your primary Cardiologist (physician) and Advanced Practice Providers (APPs -  Physician Assistants and Nurse Practitioners) who all work together to provide you with the care you need, when you need it.  We recommend signing up for the patient portal called "MyChart".  Sign up information is provided on this After Visit Summary.  MyChart is used to connect with patients for Virtual Visits (Telemedicine).  Patients are able to view lab/test results, encounter notes, upcoming appointments, etc.  Non-urgent messages can be sent to your provider as well.   To learn more about what you can do with MyChart, go to [email or web address].    Your next appointment:    as needed  The format for your next appointment:   In Person  Provider:   Bryan Lemma, MD

## 2020-06-02 ENCOUNTER — Ambulatory Visit (HOSPITAL_COMMUNITY)
Admission: RE | Admit: 2020-06-02 | Payer: Medicare Other | Source: Ambulatory Visit | Attending: Cardiology | Admitting: Cardiology

## 2020-06-02 ENCOUNTER — Encounter: Payer: Self-pay | Admitting: Cardiology

## 2020-06-02 DIAGNOSIS — I350 Nonrheumatic aortic (valve) stenosis: Secondary | ICD-10-CM | POA: Insufficient documentation

## 2020-06-02 NOTE — Assessment & Plan Note (Addendum)
Chest tightness associated with dyspnea clearly not related to microvascular CAD based on coronary CTA results.  FFR results were extremely normal which would even argue against microvascular disease.  I would not continue to evaluate this from cardiac standpoint based on recent studies.  No further cardiology evaluation needed.

## 2020-06-02 NOTE — Assessment & Plan Note (Signed)
Echocardiogram shows mean gradient of 8.5 mmHg which is very mild.  Not likely to cause any symptoms.  I do not think we need to follow-up any further.

## 2020-06-02 NOTE — Assessment & Plan Note (Signed)
Chronic complaint that has been now evaluated with 2 echocardiograms, 2 coronary CT angiograms and a CPX all of which strongly suggest that the symptoms are noncardiac in nature.  I suspect this is probably multifactorial, certainly could be some microvascular component.  I simply recommended that she continue to try to exercise with reassurance that she is not having anginal equivalent dyspnea from a macrovascular disease.

## 2020-06-02 NOTE — Assessment & Plan Note (Addendum)
He does not get a coronary calcium score therefore 1 would want her LDL level to be actually less than 70, however she did not want take Zetia, and was intolerant of statins.  She is not interested in taking additional medications.  As such, we will simply monitor.  LDL is 126.  Consider discussing Red Yeast Rice.  Given her advanced age and consistent nonobstructive CAD on coronary CTA, probably not unreasonable just to simply monitor with diet and exercise and potentially red yeast rice.  If she would be agreeable, could consider bempedoic acid/Nexletol.  Will defer to PCP.

## 2020-06-02 NOTE — Assessment & Plan Note (Signed)
Blood pressure looks good.  She is not on any medications at this point.  With only grade 1 diastolic dysfunction and mild LVH on echo, not unreasonable to simply monitor.  We will try to avoid hypotension.

## 2020-06-02 NOTE — Assessment & Plan Note (Addendum)
Not really sure what to make of her leg pain.  This is a new complaint to me with exertional leg pain. Plan: Check ABIs and possible Dopplers.  If abnormal, we will refer to one of our vascular cardiologist.

## 2020-06-20 ENCOUNTER — Inpatient Hospital Stay (HOSPITAL_COMMUNITY): Admission: RE | Admit: 2020-06-20 | Payer: Medicare Other | Source: Ambulatory Visit

## 2020-09-14 ENCOUNTER — Encounter: Payer: Self-pay | Admitting: Podiatry

## 2020-09-14 ENCOUNTER — Other Ambulatory Visit: Payer: Self-pay

## 2020-09-14 ENCOUNTER — Ambulatory Visit (INDEPENDENT_AMBULATORY_CARE_PROVIDER_SITE_OTHER): Payer: Medicare Other | Admitting: Podiatry

## 2020-09-14 DIAGNOSIS — L600 Ingrowing nail: Secondary | ICD-10-CM | POA: Diagnosis not present

## 2020-09-14 DIAGNOSIS — B351 Tinea unguium: Secondary | ICD-10-CM | POA: Diagnosis not present

## 2020-09-17 NOTE — Progress Notes (Signed)
Subjective:   Patient ID: Summer Davis, female   DOB: 87 y.o.   MRN: [id number]   HPI Patient states she has chronic ingrown toenails with thickness of her nailbeds and states it is hard for her to take care of them herself and gradually they have become more of an issue over time.  She does not smoke likes to be active   Review of Systems  All other systems reviewed and are negative.       Objective:  Physical Exam Vitals and nursing note reviewed.  Constitutional:      Appearance: She is well-developed.  Pulmonary:     Effort: Pulmonary effort is normal.  Musculoskeletal:        General: Normal range of motion.  Skin:    General: Skin is warm.  Neurological:     Mental Status: She is alert.     Neurovascular status found to be intact muscle strength was found to be mildly diminished range of motion diminished subtalar midtarsal joint.  She is got thickened yellow brittle nailbeds 1-5 both feet that get incurvated in the corners with chronic ingrown toenails that do get painful     Assessment:  Combination mycotic nail infection with pain along with chronic ingrown toenails     Plan:  H&P reviewed condition discussed in today debrided nailbeds 1-5 both feet no iatrogenic bleeding and this can be done routinely.  Patient will be seen back to recheck

## 2020-10-29 ENCOUNTER — Other Ambulatory Visit: Payer: Self-pay

## 2020-10-29 ENCOUNTER — Emergency Department (HOSPITAL_COMMUNITY)
Admission: EM | Admit: 2020-10-29 | Discharge: 2020-10-29 | Disposition: A | Discharge status: Home / Self Care | Payer: Medicare Other | Attending: Emergency Medicine | Admitting: Emergency Medicine

## 2020-10-29 ENCOUNTER — Emergency Department (HOSPITAL_COMMUNITY): Payer: Medicare Other

## 2020-10-29 DIAGNOSIS — W01198A Fall on same level from slipping, tripping and stumbling with subsequent striking against other object, initial encounter: Secondary | ICD-10-CM | POA: Diagnosis not present

## 2020-10-29 DIAGNOSIS — Z79899 Other long term (current) drug therapy: Secondary | ICD-10-CM | POA: Diagnosis not present

## 2020-10-29 DIAGNOSIS — Z23 Encounter for immunization: Secondary | ICD-10-CM | POA: Insufficient documentation

## 2020-10-29 DIAGNOSIS — I10 Essential (primary) hypertension: Secondary | ICD-10-CM | POA: Insufficient documentation

## 2020-10-29 DIAGNOSIS — Z96651 Presence of right artificial knee joint: Secondary | ICD-10-CM | POA: Insufficient documentation

## 2020-10-29 DIAGNOSIS — E039 Hypothyroidism, unspecified: Secondary | ICD-10-CM | POA: Insufficient documentation

## 2020-10-29 DIAGNOSIS — S0101XA Laceration without foreign body of scalp, initial encounter: Secondary | ICD-10-CM | POA: Diagnosis not present

## 2020-10-29 DIAGNOSIS — S0990XA Unspecified injury of head, initial encounter: Secondary | ICD-10-CM | POA: Diagnosis present

## 2020-10-29 DIAGNOSIS — Z96642 Presence of left artificial hip joint: Secondary | ICD-10-CM | POA: Insufficient documentation

## 2020-10-29 DIAGNOSIS — W19XXXA Unspecified fall, initial encounter: Secondary | ICD-10-CM

## 2020-10-29 MED ORDER — ACETAMINOPHEN 500 MG PO TABS
1000.0000 mg | ORAL_TABLET | Freq: Once | ORAL | Status: AC
  Administered 2020-10-29: 1000 mg via ORAL
  Filled 2020-10-29: qty 2

## 2020-10-29 MED ORDER — TETANUS-DIPHTH-ACELL PERTUSSIS 5-2.5-18.5 LF-MCG/0.5 IM SUSY
0.5000 mL | PREFILLED_SYRINGE | Freq: Once | INTRAMUSCULAR | Status: AC
  Administered 2020-10-29: 0.5 mL via INTRAMUSCULAR
  Filled 2020-10-29: qty 0.5

## 2020-10-29 NOTE — ED Notes (Signed)
Patient transported to CT 

## 2020-10-29 NOTE — Discharge Instructions (Addendum)
Your staples will need to be removed in your doctor's office in 7 to 10 days.  Keep your wound dry for the next 24 hours.  Beginning tomorrow you can shower normally.  Do not put shampoo or other products in your hair and do not scrub your wound.  You should not put peroxide or anything other than soap and regular water on your head.  You may continue to have a very small amount of bleeding or trickling for the next 24 to 48 hours.  We gave you extra bandages, but consider sleeping on a clean towel to prevent bleeding onto bedding.  You can take Tylenol as needed at home for pain.  *  Your CT scan of the brain and upper spine did not show signs of brain bleeding or broken bones.

## 2020-10-29 NOTE — ED Triage Notes (Signed)
Pt was watering her flowers, stumbled and hit the back of her head on concrete, denies LOC, blurred vision or any other injuries. Blood noted to posterior head, no other injuries noted.

## 2020-10-29 NOTE — ED Provider Notes (Signed)
[facility]EMERGENCY DEPT Provider Note   CSN: [id number] Arrival date & time: 10/29/20  1729     History Chief Complaint  Patient presents with   Fall   Head Laceration    Summer Davis is a 87 y.o. female presenting the emergency department a fall and head injury.  She reports that she tripped on the sidewalk and fell backwards today and struck the back of her head.  There was no loss of consciousness.  He had bleeding initially but it has stopped.  She is not on blood thinners.  She denies neck pain or injury anywhere else on her body.  She is not sure if she has had a tetanus shot in the past several years.  HPI     Past Medical History:  Diagnosis Date   Arthritis    Left hip   Cataracts, bilateral    Followed by Dr. Gwendalyn Ege & Dr. Rudene Christians @ [facility] ophthalmology.   Chronic low back pain    DOE (dyspnea on exertion) 2013   A) 2013-nonischemic Myoview; January 2019-CPX  - Normal.  Echo 01/2017 - Normal   Esophageal reflux    GERD (gastroesophageal reflux disease)    H/O scoliosis    Hypercholesterolemia    Hypothyroidism    Insomnia    Lichen sclerosus    Memory difficulty    Mild aortic stenosis by prior echocardiogram    Echo 04/13/2020: Mild aortic valve stenosis with mean gradient of 8.3 mmHg   PONV (postoperative nausea and vomiting)    Tortuous colon    UTI (lower urinary tract infection)    Varicose veins of both lower extremities with pain    h/x varicose veins - with endoscopic vein ablation    Patient Active Problem List   Diagnosis Date Noted   Mild aortic stenosis by prior echocardiography 06/02/2020   Bilateral leg pain 05/24/2020   Hyperlipidemia LDL goal <100 03/20/2020   Abnormal chest x-ray 07/17/2018   Upper airway cough syndrome 03/28/2017   Dyspnea on exertion 10/31/2016   Exercise intolerance 10/31/2016   Essential hypertension 10/31/2016   Memory loss 06/06/2015   Paresthesia 01/13/2013   Expected blood loss  anemia 11/19/2012   S/P right TKA 11/17/2012   Chest pain with low risk for cardiac etiology 02/28/2012   Arthritis, hip 10/04/2011   Scoliosis 10/04/2011    Past Surgical History:  Procedure Laterality Date   CARDIOPULMONARY EXERCISE TEST  05/2017   : Relatively unremarkable.  Excellent exercise tolerance.   CATARACT EXTRACTION Bilateral 2016   Left-sided surgery was unsuccessful.   CHOLECYSTECTOMY  1976   CORONARY CALCIUM SCORE  04/17/2020   Calcium score 528, moderate stenosis of CAD-RADS 3.  CT FFR evaluation of Left Main disease was 0.97.  LAD was 0.90, LCx 0.96 and RCA 0.92.  (All nonsignificant nonobstructive).  Mild calcified plaque in the proximal LAD 25 and 50%.  Aortic atherosclerosis noted.   CORONARY CT ANGIOGRAM  11/20/2016   Coronary Calcium Score 367 (HIGH). <30% calcified proximal LAD, otherwise no significant disease.  Calcification of the Aortic Valve base near Left Main.  No significant noncardiac findings   ENDOSCOPIC VEIN LASER TREATMENT     EYE MUSCLE SURGERY Left    FOOT NEUROMA SURGERY Right    NM MYOVIEW LTD  02/2012   No evidence of ischemia or infarction. Normal ejection fraction.   TONSILLECTOMY AND ADENOIDECTOMY  1938   TOTAL HIP ARTHROPLASTY Left 2000   Left   TOTAL KNEE  ARTHROPLASTY Right 11/17/2012   Procedure: RIGHT TOTAL KNEE ARTHROPLASTY;  Surgeon: [name], MD;  Location: [facility];  Service: Orthopedics;  Laterality: Right;   TRANSTHORACIC ECHOCARDIOGRAM  01/14/2017    EF 55 to 60%.  GR 1 DD.  Aortic valve sclerosis with mild AI.  Mild TR.   TRANSTHORACIC ECHOCARDIOGRAM  04/13/2020   EF 60 to 70%.  Mild LVH with GR 1 DD.  Trivial small pericardial effusion, mild aortic valve stenosis with mean gradient of 8.3 mmHg     OB History   No obstetric history on file.     Family History  Problem Relation Age of Onset   Heart disease Father        Later onset CAD and heart failure   Cancer Mother        Liver and pancreas   Hypertension  Sister    Hyperlipidemia Brother     Social History   Tobacco Use   Smoking status: Never   Smokeless tobacco: Never  Substance Use Topics   Alcohol use: No   Drug use: No    Home Medications Prior to Admission medications   Medication Sig Start Date End Date Taking? Authorizing Provider  Apoaequorin (PREVAGEN PO) Take by mouth daily.    [provider]  Biotin 10 MG TABS Take 1 tablet daily by mouth.    [provider]  Calcium Carb-Cholecalciferol 600-800 MG-UNIT TABS Take 1 tablet daily by mouth.    [provider]  clobetasol ointment (TEMOVATE) 0.05 % SMARTSIG:Sparingly Topical PRN 10/22/19   [provider]  gabapentin (NEURONTIN) 100 MG capsule TK 1 Cap PO IN THE MORNING AND 3 Caps PO IN THE EVE UTD 04/24/15   [provider]  levothyroxine (SYNTHROID, LEVOTHROID) 75 MCG tablet Take 75 mcg by mouth every other day. SUN, Tuesday, Thursday, Sat    [provider]  levothyroxine (SYNTHROID, LEVOTHROID) 88 MCG tablet Take 88 mcg by mouth every other day. MON, WED, FRIDAY    [provider]  Multiple Vitamins-Minerals (MULTIVITAMIN WITH MINERALS) tablet Take 1 tablet by mouth daily.    [provider]  Multiple Vitamins-Minerals (PRESERVISION AREDS 2) CAPS Take 2 capsules by mouth daily.    [provider]  Polyethyl Glycol-Propyl Glycol 0.4-0.3 % SOLN Place 1 drop into both eyes daily as needed (for dry eyes).    [provider]    Allergies    Alendronate sodium, Codeine, Lipitor [atorvastatin], Oxycodone, Zetia [ezetimibe], and Zocor [simvastatin]  Review of Systems   Review of Systems  Constitutional:  Negative for chills and fever.  Respiratory:  Negative for cough and shortness of breath.   Cardiovascular:  Negative for chest pain and palpitations.  Gastrointestinal:  Negative for abdominal pain and vomiting.  Musculoskeletal:  Negative for myalgias and neck pain.  Skin:  Positive  for rash and wound.  Neurological:  Positive for headaches. Negative for syncope.  All other systems reviewed and are negative.  Physical Exam Updated Vital Signs BP (!) 184/94 (BP Location: Left Arm)   Pulse (!) 105   Temp 98 F (36.7 C) (Oral)   Resp 18   Ht 5\' 2"  (1.575 m)   Wt 51.7 kg   SpO2 98%   BMI 20.85 kg/m   Physical Exam Constitutional:      General: She is not in acute distress. HENT:     Head: Normocephalic.     Comments: Small laceration to occiput, dried blood in hair, no active  bleeding Eyes:     Conjunctiva/sclera: Conjunctivae normal.     Pupils: Pupils are equal, round, and reactive to light.  Cardiovascular:     Rate and Rhythm: Normal rate and regular rhythm.  Pulmonary:     Effort: Pulmonary effort is normal. No respiratory distress.  Skin:    General: Skin is warm and dry.  Neurological:     General: No focal deficit present.     Mental Status: She is alert. Mental status is at baseline.  Psychiatric:        Mood and Affect: Mood normal.        Behavior: Behavior normal.    ED Results / Procedures / Treatments   Labs (all labs ordered are listed, but only abnormal results are displayed) Labs Reviewed - No data to display  EKG None  Radiology No results found.  Procedures .Marland Kitchen.Laceration Repair  Date/Time: 10/29/2020 7:39 PM Performed by: Terald Sleeperrifan, Suhey Radford J, MD Authorized by: Terald Sleeperrifan, Amish Mintzer J, MD   Consent:    Consent obtained:  Verbal   Consent given by:  Patient   Risks, benefits, and alternatives were discussed: yes     Risks discussed:  Infection, pain and poor cosmetic result Universal protocol:    Procedure explained and questions answered to patient or proxy's satisfaction: yes     Relevant documents present and verified: yes     Test results available: yes     Imaging studies available: yes     Required blood products, implants, devices, and special equipment available: yes     Site/side marked: yes     Immediately prior  to procedure, a time out was called: yes     Patient identity confirmed:  Arm band and verbally with patient Anesthesia:    Anesthesia method:  None Laceration details:    Location:  Scalp   Scalp location:  Occipital   Length (cm):  3   Depth (mm):  2 Pre-procedure details:    Preparation:  Patient was prepped and draped in usual sterile fashion Exploration:    Imaging outcome: foreign body not noted     Wound exploration: wound explored through full range of motion     Contaminated: no   Treatment:    Area cleansed with:  Saline   Amount of cleaning:  Standard   Irrigation solution:  Sterile saline   Visualized foreign bodies/material removed: no     Undermining:  None   Scar revision: no   Skin repair:    Repair method:  Staples   Number of staples:  4 Approximation:    Approximation:  Close Repair type:    Repair type:  Simple Post-procedure details:    Dressing:  Non-adherent dressing   Procedure completion:  Tolerated well, no immediate complications   Medications Ordered in ED Medications - No data to display  ED Course  I have reviewed the triage vital signs and the nursing notes.  Pertinent labs & imaging results that were available during my care of the patient were reviewed by me and considered in my medical decision making (see chart for details).  Patient with a fall from standing with a small laceration to the back of the scalp.  Given her age we will obtain a CT scan of the head and upper neck.  We'll update tetanus vaccine here.  Tylenol for pain.  Will irrigate and clean wound, and determine if there is room for suture or staples here.  No other traumatic injuries noted on  exam  Clinical Course as of 10/29/20 1939  Wynelle Link Oct 29, 2020  1907 No acute injuries noted on CT imaging.  The patient's wound has been irrigated and cleared.  3 staples were placed.  Follow-up instructions were provided for staple removal in 10 days with PCP [MT]    Clinical Course  User Index [MT] Trevonne Nyland, Kermit Balo, MD    Final Clinical Impression(s) / ED Diagnoses Final diagnoses:  None    Rx / DC Orders ED Discharge Orders     None        Renaye Rakers Kermit Balo, MD 10/29/20 1940

## 2020-12-08 ENCOUNTER — Telehealth: Payer: Self-pay | Admitting: Internal Medicine

## 2020-12-08 ENCOUNTER — Other Ambulatory Visit: Payer: Self-pay

## 2020-12-08 ENCOUNTER — Encounter: Payer: Self-pay | Admitting: Internal Medicine

## 2020-12-08 ENCOUNTER — Ambulatory Visit (INDEPENDENT_AMBULATORY_CARE_PROVIDER_SITE_OTHER): Payer: Medicare Other | Admitting: Internal Medicine

## 2020-12-08 VITALS — BP 130/80 | HR 90 | Ht 61.5 in | Wt 112.0 lb

## 2020-12-08 DIAGNOSIS — M810 Age-related osteoporosis without current pathological fracture: Secondary | ICD-10-CM | POA: Diagnosis not present

## 2020-12-08 LAB — ALBUMIN: Albumin: 4.2 g/dL (ref 3.5–5.2)

## 2020-12-08 LAB — BASIC METABOLIC PANEL
BUN: 15 mg/dL (ref 6–23)
CO2: 29 mEq/L (ref 19–32)
Calcium: 9.7 mg/dL (ref 8.4–10.5)
Chloride: 98 mEq/L (ref 96–112)
Creatinine, Ser: 0.71 mg/dL (ref 0.40–1.20)
GFR: 76.12 mL/min (ref >60.00)
Glucose, Bld: 93 mg/dL (ref 70–99)
Potassium: 4.8 mEq/L (ref 3.5–5.1)
Sodium: 135 mEq/L (ref 135–145)

## 2020-12-08 NOTE — Telephone Encounter (Signed)
Please add this pt to reclast list     Thanks

## 2020-12-08 NOTE — Progress Notes (Signed)
Name: Summer Davis  MRN/ DOB: [id number], 10-04-1930    Age/ Sex: 87 y.o., female    PCP: Tally Joe, MD   Reason for Endocrinology Evaluation: Osteoporosis      Date of Initial Endocrinology Evaluation: 12/08/2020     HPI: Summer Davis is a 87 y.o. female with a past medical history of Osteoporosis, GERD, insomnia and hypothyroidism. The patient presented for initial endocrinology clinic visit on 12/08/2020 for consultative assistance with her Osteoporosis .    Pt was diagnosed with osteoporosis: years ago, her last DXA 2021 with a Tscore at the left radius -5.3   Menarche at age : age 45  Menopausal at age : age 54 Fracture Hx: no Hx of HRT: no  FH of osteoporosis or hip fracture: no Prior Hx of anti-estrogenic therapy : Prior Hx of anti-resorptive therapy : Alendronate- diarrhea, constipation and difficulty swallowing , Prolia - nausea and chills   She is on calcium 650 BID  Vitamin D 1000 iu through  [facility]   She has shingles since February   Has hx of skin cancer  She has no prior exposure to exposure  Has no   HISTORY:  Past Medical History:  Past Medical History:  Diagnosis Date   Arthritis    Left hip   Cataracts, bilateral    Followed by Dr. Gwendalyn Ege & Dr. Rudene Christians @ [facility] ophthalmology.   Chronic low back pain    DOE (dyspnea on exertion) 2013   A) 2013-nonischemic Myoview; January 2019-CPX  - Normal.  Echo 01/2017 - Normal   Esophageal reflux    GERD (gastroesophageal reflux disease)    H/O scoliosis    Hypercholesterolemia    Hypothyroidism    Insomnia    Lichen sclerosus    Memory difficulty    Mild aortic stenosis by prior echocardiogram    Echo 04/13/2020: Mild aortic valve stenosis with mean gradient of 8.3 mmHg   PONV (postoperative nausea and vomiting)    Tortuous colon    UTI (lower urinary tract infection)    Varicose veins of both lower extremities with pain    h/x varicose veins - with endoscopic vein ablation   Past  Surgical History:  Past Surgical History:  Procedure Laterality Date   CARDIOPULMONARY EXERCISE TEST  05/2017   : Relatively unremarkable.  Excellent exercise tolerance.   CATARACT EXTRACTION Bilateral 2016   Left-sided surgery was unsuccessful.   CHOLECYSTECTOMY  1976   CORONARY CALCIUM SCORE  04/17/2020   Calcium score 528, moderate stenosis of CAD-RADS 3.  CT FFR evaluation of Left Main disease was 0.97.  LAD was 0.90, LCx 0.96 and RCA 0.92.  (All nonsignificant nonobstructive).  Mild calcified plaque in the proximal LAD 25 and 50%.  Aortic atherosclerosis noted.   CORONARY CT ANGIOGRAM  11/20/2016   Coronary Calcium Score 367 (HIGH). <30% calcified proximal LAD, otherwise no significant disease.  Calcification of the Aortic Valve base near Left Main.  No significant noncardiac findings   ENDOSCOPIC VEIN LASER TREATMENT     EYE MUSCLE SURGERY Left    FOOT NEUROMA SURGERY Right    NM MYOVIEW LTD  02/2012   No evidence of ischemia or infarction. Normal ejection fraction.   TONSILLECTOMY AND ADENOIDECTOMY  1938   TOTAL HIP ARTHROPLASTY Left 2000   Left   TOTAL KNEE ARTHROPLASTY Right 11/17/2012   Procedure: RIGHT TOTAL KNEE ARTHROPLASTY;  Surgeon: [name], MD;  Location: [facility];  Service: Orthopedics;  Laterality:  Right;   TRANSTHORACIC ECHOCARDIOGRAM  01/14/2017    EF 55 to 60%.  GR 1 DD.  Aortic valve sclerosis with mild AI.  Mild TR.   TRANSTHORACIC ECHOCARDIOGRAM  04/13/2020   EF 60 to 70%.  Mild LVH with GR 1 DD.  Trivial small pericardial effusion, mild aortic valve stenosis with mean gradient of 8.3 mmHg    Social History:  reports that she has never smoked. She has never used smokeless tobacco. She reports that she does not drink alcohol and does not use drugs. Family History: family history includes Cancer in her mother; Heart disease in her father; Hyperlipidemia in her brother; Hypertension in her sister.   HOME MEDICATIONS: Allergies as of 12/08/2020       Reactions    Alendronate Sodium    Codeine    Lipitor [atorvastatin]    Oxycodone Nausea And Vomiting   Prolia [denosumab]    Chills and nausea    Zetia [ezetimibe]    Zocor [simvastatin]         Medication List        Accurate as of December 08, 2020  8:58 AM. If you have any questions, ask your nurse or doctor.          Biotin 10 MG Tabs Take 1 tablet daily by mouth.   Calcium Carb-Cholecalciferol 600-800 MG-UNIT Tabs Take 1 tablet daily by mouth.   clobetasol ointment 0.05 % Commonly known as: TEMOVATE SMARTSIG:Sparingly Topical PRN   gabapentin 100 MG capsule Commonly known as: NEURONTIN Take 200 mg by mouth 2 (two) times daily.   levothyroxine 75 MCG tablet Commonly known as: SYNTHROID Take 75 mcg by mouth every other day. SUN, Tuesday, Thursday, Sat   levothyroxine 88 MCG tablet Commonly known as: SYNTHROID Take 88 mcg by mouth every other day. MON, WED, FRIDAY   Polyethyl Glycol-Propyl Glycol 0.4-0.3 % Soln Place 1 drop into both eyes daily as needed (for dry eyes).   PreserVision AREDS 2 Caps Take 2 capsules by mouth daily.   multivitamin with minerals tablet Take 1 tablet by mouth daily.   PREVAGEN PO Take by mouth daily.          REVIEW OF SYSTEMS: A comprehensive ROS was conducted with the patient and is negative except as per HPI and below:  Review of Systems  Gastrointestinal:  Positive for nausea.  Psychiatric/Behavioral:         Memory changes       OBJECTIVE:  VS: BP 130/80   Pulse 90   Ht 5' 1.5" (1.562 m)   Wt 112 lb (50.8 kg)   SpO2 98%   BMI 20.82 kg/m    Wt Readings from Last 3 Encounters:  12/08/20 112 lb (50.8 kg)  10/29/20 114 lb (51.7 kg)  05/24/20 113 lb 12.8 oz (51.6 kg)     EXAM: General: Pt appears well and is in NAD  Neck: General: Supple without adenopathy. Thyroid: Thyroid size normal.  No goiter or nodules appreciated.   Lungs: Clear with good BS bilat with no rales, rhonchi, or wheezes  Heart: Auscultation:  RRR.  Extremities:  BL LE: No pretibial edema normal ROM and strength.  Mental Status: Judgment, insight: Intact Orientation: Oriented to time, place, and person  Mood and affect: No depression, anxiety, or agitation     DATA REVIEWED:   Results for Summer Davis, Summer Davis (MRN [id number]) as of 12/11/2020 11:21  Ref. Range 12/08/2020 09:03  Sodium Latest Ref Range: 135 - 145 mEq/L 135  Potassium Latest Ref Range: 3.5 - 5.1 mEq/L 4.8  Chloride Latest Ref Range: 96 - 112 mEq/L 98  CO2 Latest Ref Range: 19 - 32 mEq/L 29  Glucose Latest Ref Range: 70 - 99 mg/dL 93  BUN Latest Ref Range: 6 - 23 mg/dL 15  Creatinine Latest Ref Range: 0.40 - 1.20 mg/dL 9.14  Calcium Latest Ref Range: 8.4 - 10.5 mg/dL 9.7  Albumin Latest Ref Range: 3.5 - 5.2 g/dL 4.2  GFR Latest Ref Range: >60.00 mL/min 76.12    Results:  Femoral neck (FN) 33% distal radius  T-score RFN: -2.5  -5.3  Change in BMD from previous DXA test (%) from 2018 Down 4% Down 17%  (*) statistically significant       ASSESSMENT/PLAN/RECOMMENDATIONS:   Osteoporosis :  - She is intolerant to alendronate due to GI side effects  - She is intolerant to Prolia due to nausea and chills  - We discussed PTH analogues such as fortea as I believe this is good option given the severity of osteoporosis at the distal radius but she declines daily injections  - She has opted for zoledronic acid as this is annual.   Medications : Calcium 650 mg daily Vitamin D 1000 iu daily through [facility] Start Zoledronic acid 5 mg annually    F/U in 1 yr   Signed electronically by: Lyndle Herrlich, MD  [facility] Endocrinology  [facility] [address] [place] [place], [place] [address] Phone: [phone] FAX: [phone]   CC: Tally Joe, MD [address] [place] [address] Phone: [phone] Fax: [phone]   Return to Endocrinology clinic as below: Future Appointments  Date Time Provider  Department Center  12/20/2020 10:00 AM Freddie Breech, DPM [facility]

## 2020-12-08 NOTE — Patient Instructions (Addendum)
-   Please continue Calcium 650 mg twice daily  - Please continue multivitamin    - You should get a call within the next few weeks to schedule you for Reclast infusion

## 2020-12-11 ENCOUNTER — Encounter: Payer: Self-pay | Admitting: Internal Medicine

## 2020-12-11 LAB — PARATHYROID HORMONE, INTACT (NO CA): PTH: 16 pg/mL (ref 16–77)

## 2020-12-13 NOTE — Telephone Encounter (Signed)
Message left on her machine to call me to schedule reclast infusion

## 2020-12-15 ENCOUNTER — Ambulatory Visit: Payer: Medicare Other | Admitting: Podiatry

## 2020-12-20 ENCOUNTER — Encounter: Payer: Self-pay | Admitting: Podiatry

## 2020-12-20 ENCOUNTER — Ambulatory Visit (INDEPENDENT_AMBULATORY_CARE_PROVIDER_SITE_OTHER): Payer: Medicare Other | Admitting: Podiatry

## 2020-12-20 ENCOUNTER — Other Ambulatory Visit: Payer: Self-pay

## 2020-12-20 DIAGNOSIS — L853 Xerosis cutis: Secondary | ICD-10-CM

## 2020-12-20 DIAGNOSIS — M79675 Pain in left toe(s): Secondary | ICD-10-CM | POA: Diagnosis not present

## 2020-12-20 DIAGNOSIS — M79674 Pain in right toe(s): Secondary | ICD-10-CM | POA: Diagnosis not present

## 2020-12-20 DIAGNOSIS — B351 Tinea unguium: Secondary | ICD-10-CM

## 2020-12-20 NOTE — Telephone Encounter (Signed)
Appointment scheduled for the 16th.

## 2020-12-24 NOTE — Progress Notes (Signed)
Subjective: Summer Davis is a pleasant 87 y.o. female patient seen today painful thick toenails that are difficult to trim. Pain interferes with ambulation. Aggravating factors include wearing enclosed shoe gear. Pain is relieved with periodic professional debridement.  Patient states she has dry skin plantarly of both feet.  She voices no other pedal problems on today's visit.  Allergies  Allergen Reactions   Alendronate Sodium    Codeine    Lipitor [Atorvastatin]    Oxycodone Nausea And Vomiting   Prolia [Denosumab]     Chills and nausea    Zetia [Ezetimibe]    Zocor [Simvastatin]     Objective: Physical Exam  General: Summer Davis is a pleasant 87 y.o. Caucasian female, in NAD. AAO x 3.   Vascular:  Capillary fill time to digits <3 seconds b/l lower extremities. Palpable DP pulse(s) b/l lower extremities Palpable PT pulse(s) b/l lower extremities Pedal hair absent. Lower extremity skin temperature gradient within normal limits. No pain with calf compression b/l. No edema noted b/l lower extremities. Varicosities present b/l.  Dermatological:  Pedal skin is thin shiny, atrophic b/l lower extremities. No open wounds b/l lower extremities. No interdigital macerations b/l lower extremities. Toenails 1-5 b/l elongated, discolored, dystrophic, thickened, crumbly with subungual debris and tenderness to dorsal palpation.  Musculoskeletal:  Normal muscle strength 5/5 to all lower extremity muscle groups bilaterally. No pain crepitus or joint limitation noted with ROM b/l lower extremities.  Neurological:  Protective sensation intact 5/5 intact bilaterally with 10g monofilament b/l.  Assessment and Plan:  1. Pain due to onychomycosis of toenails of both feet   2. Xerosis cutis      -Examined patient. -For dry skin, recommended AquaPhor Healing Ointment. Apply to feet once daily.. -Patient to continue soft, supportive shoe gear daily. -Toenails 1-5 b/l were debrided in  length and girth with sterile nail nippers and dremel without iatrogenic bleeding.  -Patient to report any pedal injuries to medical professional immediately. -Patient/POA to call should there be question/concern in the interim.  Return in about 3 months (around 03/22/2021).  Freddie Breech, DPM

## 2020-12-26 ENCOUNTER — Ambulatory Visit (INDEPENDENT_AMBULATORY_CARE_PROVIDER_SITE_OTHER): Payer: Medicare Other | Admitting: Nutrition

## 2020-12-26 ENCOUNTER — Other Ambulatory Visit: Payer: Self-pay

## 2020-12-26 DIAGNOSIS — M810 Age-related osteoporosis without current pathological fracture: Secondary | ICD-10-CM | POA: Diagnosis not present

## 2020-12-27 NOTE — Progress Notes (Signed)
At patient's request, we discussed how Zolendronic acid slows bon loss, and the possible side effects of this medication. She reported good understanding of this  BP before starting: 140/80. Per Dr. Harvel Ricks note on 10/30/20 and after the patient signed the consent, an IV was started in patient's left arm. 2ccs of Normal Saline was infused, and no signes of infiltration were seen.  At 2:10 PM, 5 mg of zolendronic acid was begun, and infused until 2:35 PM.  Patient denied any symptoms of light headedness, or dizziness, nor any discomfort at the IV site.  Normal saline was again infused to flush the tubing, and the IV was discontinued.  Site showed no signes of redness or swelling.  Pt. Was encouraged to continue her Calcium and vit D per Dr. Harvel Ricks orders and to drink at least 3-4 glasses of water this afternoon.  She agreed to do this and had no final questions.

## 2020-12-27 NOTE — Patient Instructions (Signed)
Continue to take your calcium and Vit. D per Dr. Harvel Ricks orders Drink 3-4 glasses of water today Return in one year

## 2021-03-28 ENCOUNTER — Ambulatory Visit (INDEPENDENT_AMBULATORY_CARE_PROVIDER_SITE_OTHER): Payer: Medicare Other | Admitting: Podiatry

## 2021-03-28 ENCOUNTER — Other Ambulatory Visit: Payer: Self-pay

## 2021-03-28 ENCOUNTER — Encounter: Payer: Self-pay | Admitting: Podiatry

## 2021-03-28 DIAGNOSIS — M79675 Pain in left toe(s): Secondary | ICD-10-CM

## 2021-03-28 DIAGNOSIS — J309 Allergic rhinitis, unspecified: Secondary | ICD-10-CM | POA: Insufficient documentation

## 2021-03-28 DIAGNOSIS — B351 Tinea unguium: Secondary | ICD-10-CM | POA: Diagnosis not present

## 2021-03-28 DIAGNOSIS — G43909 Migraine, unspecified, not intractable, without status migrainosus: Secondary | ICD-10-CM | POA: Insufficient documentation

## 2021-03-28 DIAGNOSIS — M545 Low back pain, unspecified: Secondary | ICD-10-CM | POA: Insufficient documentation

## 2021-03-28 DIAGNOSIS — M79674 Pain in right toe(s): Secondary | ICD-10-CM | POA: Diagnosis not present

## 2021-03-28 DIAGNOSIS — M810 Age-related osteoporosis without current pathological fracture: Secondary | ICD-10-CM | POA: Insufficient documentation

## 2021-03-28 DIAGNOSIS — F4321 Adjustment disorder with depressed mood: Secondary | ICD-10-CM | POA: Insufficient documentation

## 2021-03-28 DIAGNOSIS — B0229 Other postherpetic nervous system involvement: Secondary | ICD-10-CM | POA: Insufficient documentation

## 2021-03-28 DIAGNOSIS — J439 Emphysema, unspecified: Secondary | ICD-10-CM | POA: Insufficient documentation

## 2021-03-28 DIAGNOSIS — I700 Atherosclerosis of aorta: Secondary | ICD-10-CM | POA: Insufficient documentation

## 2021-03-28 DIAGNOSIS — E039 Hypothyroidism, unspecified: Secondary | ICD-10-CM | POA: Insufficient documentation

## 2021-03-28 DIAGNOSIS — G4700 Insomnia, unspecified: Secondary | ICD-10-CM | POA: Insufficient documentation

## 2021-03-28 DIAGNOSIS — D72828 Other elevated white blood cell count: Secondary | ICD-10-CM | POA: Insufficient documentation

## 2021-03-28 DIAGNOSIS — N952 Postmenopausal atrophic vaginitis: Secondary | ICD-10-CM | POA: Insufficient documentation

## 2021-03-28 DIAGNOSIS — I868 Varicose veins of other specified sites: Secondary | ICD-10-CM | POA: Insufficient documentation

## 2021-03-28 DIAGNOSIS — L900 Lichen sclerosus et atrophicus: Secondary | ICD-10-CM | POA: Insufficient documentation

## 2021-03-28 DIAGNOSIS — I351 Nonrheumatic aortic (valve) insufficiency: Secondary | ICD-10-CM | POA: Insufficient documentation

## 2021-04-01 NOTE — Progress Notes (Signed)
  Subjective:  Patient ID: Summer Davis, female    DOB: 05/30/1930,  MRN: [id number]  Summer Davis presents to clinic today for painful elongated mycotic toenails 1-5 bilaterally which are tender when wearing enclosed shoe gear. Pain is relieved with periodic professional debridement.  She notes no new pedal problems on today's visit.  PCP is Tally Joe, MD , and last visit was 03/15/2021.  Allergies  Allergen Reactions   Alendronate Sodium     Other reaction(s): diarrrhea, constipation, difficulty swallowing   Atorvastatin     Other reaction(s): myalgia   Codeine     Other reaction(s): Unknown   Denosumab     Chills and nausea  Other reaction(s): nausea and chills   Ezetimibe     Other reaction(s): fatigue   Oxycodone Nausea And Vomiting   Simvastatin     Other reaction(s): myalgia    Review of Systems: Negative except as noted in the HPI. Objective:   Constitutional Summer Davis is a pleasant 87 y.o. Caucasian female, WD, WN in NAD. AAO x 3.   Vascular CFT <3 seconds b/l LE. Palpable DP/PT pulses b/l LE. Digital hair absent b/l. Skin temperature gradient WNL b/l. No pain with calf compression b/l. No edema noted b/l. No cyanosis or clubbing noted b/l LE. Varicosities present b/l. No cyanosis or clubbing noted.  Neurologic Normal speech. Oriented to person, place, and time. Protective sensation intact 5/5 intact bilaterally with 10g monofilament b/l. Vibratory sensation intact b/l.  Dermatologic Pedal skin thin, shiny and atrophic b/l LE. No open wounds b/l LE. No interdigital macerations noted b/l LE. Toenails 1-5 b/l elongated, discolored, dystrophic, thickened, crumbly with subungual debris and tenderness to dorsal palpation.  Orthopedic: Normal muscle strength 5/5 to all lower extremity muscle groups bilaterally. No pain, crepitus or joint limitation noted with ROM b/l LE. No gross bony pedal deformities b/l. Patient ambulates independently without assistive  aids.   Radiographs: None Assessment:   1. Pain due to onychomycosis of toenails of both feet    Plan:  Patient was evaluated and treated and all questions answered. Consent given for treatment as described below: -No new findings. No new orders. -Mycotic toenails 1-5 bilaterally were debrided in length and girth with sterile nail nippers and dremel without incident. -Patient/POA to call should there be question/concern in the interim.  Return in about 3 months (around 06/28/2021).  Freddie Breech, DPM

## 2021-07-09 ENCOUNTER — Ambulatory Visit: Payer: Medicare Other | Admitting: Podiatry

## 2021-07-13 ENCOUNTER — Ambulatory Visit (INDEPENDENT_AMBULATORY_CARE_PROVIDER_SITE_OTHER): Payer: Medicare Other | Admitting: Podiatry

## 2021-07-13 ENCOUNTER — Encounter: Payer: Self-pay | Admitting: Podiatry

## 2021-07-13 ENCOUNTER — Other Ambulatory Visit: Payer: Self-pay

## 2021-07-13 DIAGNOSIS — R300 Dysuria: Secondary | ICD-10-CM | POA: Insufficient documentation

## 2021-07-13 DIAGNOSIS — M79675 Pain in left toe(s): Secondary | ICD-10-CM

## 2021-07-13 DIAGNOSIS — B351 Tinea unguium: Secondary | ICD-10-CM | POA: Diagnosis not present

## 2021-07-13 DIAGNOSIS — R030 Elevated blood-pressure reading, without diagnosis of hypertension: Secondary | ICD-10-CM | POA: Insufficient documentation

## 2021-07-13 DIAGNOSIS — M79674 Pain in right toe(s): Secondary | ICD-10-CM | POA: Diagnosis not present

## 2021-07-13 DIAGNOSIS — N3000 Acute cystitis without hematuria: Secondary | ICD-10-CM | POA: Insufficient documentation

## 2021-07-22 NOTE — Progress Notes (Signed)
?  Subjective:  ?Patient ID: Summer Davis, female    DOB: [birth year],  MRN: [id number] ? ?Summer Davis presents to clinic today for painful thick toenails that are difficult to trim. Pain interferes with ambulation. Aggravating factors include wearing enclosed shoe gear. Pain is relieved with periodic professional debridement. ? ?Patient states her left 3rd toe was painful after last visit for a few days. ? ?PCP is Tally Joe, MD , and last visit was last week. ? ?Allergies  ?Allergen Reactions  ? Alendronate Sodium   ?  Other reaction(s): diarrrhea, constipation, difficulty swallowing  ? Atorvastatin   ?  Other reaction(s): myalgia  ? Codeine   ?  Other reaction(s): Unknown ?Other reaction(s): Unknown  ? Denosumab   ?  Chills and nausea  ?Other reaction(s): nausea and chills  ? Ezetimibe   ?  Other reaction(s): fatigue  ? Oxycodone Nausea And Vomiting  ? Simvastatin   ?  Other reaction(s): myalgia ?Other reaction(s): myalgia  ? ? ?Review of Systems: Negative except as noted in the HPI. ? ?Objective: ? ?Constitutional Summer Davis is a pleasant 88 y.o. Caucasian female, WD, WN in NAD. AAO x 3.   ?Vascular CFT <3 seconds b/l LE. Palpable DP/PT pulses b/l LE. Digital hair absent b/l. Skin temperature gradient WNL b/l. No pain with calf compression b/l. No edema noted b/l. No cyanosis or clubbing noted b/l LE. Varicosities present b/l. No cyanosis or clubbing noted.  ?Neurologic Normal speech. Oriented to person, place, and time. Protective sensation intact 5/5 intact bilaterally with 10g monofilament b/l. Vibratory sensation intact b/l.  ?Dermatologic Pedal skin thin, shiny and atrophic b/l LE. No open wounds b/l LE. No interdigital macerations noted b/l LE. Toenails 1-5 b/l elongated, discolored, dystrophic, thickened, crumbly with subungual debris and tenderness to dorsal palpation.  ?Orthopedic: Normal muscle strength 5/5 to all lower extremity muscle groups bilaterally. No pain, crepitus or joint  limitation noted with ROM b/l LE. No gross bony pedal deformities b/l. Patient ambulates independently without assistive aids.  ? ?Radiographs: None ? ?HgA1c: No flowsheet data found. ? ?Assessment/Plan: ?1. Pain due to onychomycosis of toenails of both feet   ?  ?-Examined patient. ?-Patient to continue soft, supportive shoe gear daily. ?-Toenails 1-5 b/l were debrided in length and girth with sterile nail nippers and dremel without iatrogenic bleeding.  ?-Patient/POA to call should there be question/concern in the interim.  ? ?Return in about 3 months (around 10/13/2021). ? ?Freddie Breech, DPM  ?

## 2021-09-27 ENCOUNTER — Ambulatory Visit (INDEPENDENT_AMBULATORY_CARE_PROVIDER_SITE_OTHER): Payer: Medicare Other | Admitting: Neurology

## 2021-09-27 ENCOUNTER — Encounter: Payer: Self-pay | Admitting: Neurology

## 2021-09-27 VITALS — BP 171/83 | HR 73 | Ht 61.5 in | Wt 111.0 lb

## 2021-09-27 DIAGNOSIS — B0229 Other postherpetic nervous system involvement: Secondary | ICD-10-CM

## 2021-09-27 DIAGNOSIS — G2581 Restless legs syndrome: Secondary | ICD-10-CM | POA: Diagnosis not present

## 2021-09-27 DIAGNOSIS — R269 Unspecified abnormalities of gait and mobility: Secondary | ICD-10-CM | POA: Diagnosis not present

## 2021-09-27 DIAGNOSIS — R413 Other amnesia: Secondary | ICD-10-CM

## 2021-09-27 DIAGNOSIS — R638 Other symptoms and signs concerning food and fluid intake: Secondary | ICD-10-CM | POA: Diagnosis not present

## 2021-09-27 MED ORDER — LIDOCAINE-PRILOCAINE 2.5-2.5 % EX CREA: TOPICAL_CREAM | CUTANEOUS | 11 refills | Status: AC

## 2021-09-27 MED ORDER — DICLOFENAC SODIUM 1 % EX CREA: TOPICAL_CREAM | CUTANEOUS | 11 refills | Status: DC

## 2021-09-27 NOTE — Progress Notes (Signed)
Chief Complaint  Patient presents with   New Patient (Initial Visit)    Rm 15. Alone. NX Summer Davis LV 2017/[facility] Antony Haste MD/Restless legs.      ASSESSMENT AND PLAN  Summer Davis is a 88 y.o. female  Restless leg symptoms  Worsening low back pain,  likely a component of lumbar radiculopathy  Laboratory evaluations to rule out treatable etiology She is very hesitant to try any new medications, I have written EMLA gel may combine with diclofenac gel as needed  Cognitive impairment  MoCA examination 20/30 today,  CT head in June 2022 showed no acute intracranial abnormality  Most likely central nervous system degenerative disorder    DIAGNOSTIC DATA (LABS, IMAGING, TESTING) - I reviewed patient records, labs, notes, testing and imaging myself where available.  Personally reviewed CT head without contrast June 19/2022: No acute abnormality, CT cervical spine, multilevel degenerative changes, no evidence of significant canal or foraminal narrowing  MEDICAL HISTORY:  Summer Davis is a 88 year old female, seen in request by primary care physician Dr. Antony Contras, for evaluation of restless leg syndrome, memory loss initial evaluation was on Sep 27, 2021,  I reviewed and summarized the referring note. PMHX Hypothyroidism Right upper extremity shingle March 22, postherpetic neuralgia. 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, began to notice gradual onset of memory loss since 2022, she could not remember children's address, when she was writing called for her grandchildren recently, she noticed she has difficulty with their names sometimes  She had restless leg symptoms since 2019, she has the urge to move her leg before going to sleep, sometimes leg jerking will wake her up from sleep, she has to walk around, she has tried gabapentin up to 100 mg 2 tablets twice a day, she did  not like the medication side effect, make her feel drunk, not necessarily help her sleep better, she gets up to 3 times every night,  In addition, she had right anterior chest, upper extremity shingles in 2021, still feels intermittent pain,  She fell in 2021, during the pandemic, now complains of worsening low back pain, left short walking, she would feel lower extremity feeling tired, has to sit down,  She describes lower extremity discomfort with prolonged walking, increased low back pain, also complains of worsening urinary frequency, urgency since then,  PHYSICAL EXAM:   Vitals:   09/27/21 0809  BP: (!) 171/83  Pulse: 73  Weight: 111 lb (50.3 kg)  Height: 5' 1.5" (1.562 m)    Body mass index is 20.63 kg/m.  PHYSICAL EXAMNIATION:  Gen: NAD, conversant, well nourised, well groomed                     Cardiovascular: Regular rate rhythm, no peripheral edema, warm, nontender. Eyes: Conjunctivae clear without exudates or hemorrhage Neck: Supple, no carotid bruits. Pulmonary: Clear to auscultation bilaterally   NEUROLOGICAL EXAM:  MENTAL STATUS: Speech/cognition: Awake, alert, oriented to history taking and casual conversation    09/27/2021    9:00 AM  Montreal Cognitive Assessment   Visuospatial/ Executive (0/5) 3  Naming (0/3) 3  Attention: Read list of digits (0/2) 2  Attention: Read list of letters (0/1) 1  Attention: Serial 7 subtraction starting at 100 (0/3) 2  Language: Repeat phrase (0/2) 2  Language : Fluency (0/1) 1  Abstraction (0/2) 1  Delayed Recall (0/5) 0  Orientation (0/6) 5  Total 20    CRANIAL NERVES: CN II: Visual fields are full to confrontation. Pupils are round equal and briskly reactive to light. CN III, IV, VI: extraocular movement are normal. No ptosis. CN V: Facial sensation is intact to light touch CN VII: Face is symmetric with normal eye closure  CN VIII: Hearing is normal to causal conversation. CN IX, X: Phonation is normal. CN XI:  Head turning and shoulder shrug are intact  MOTOR: There is no pronator drift of out-stretched arms. Muscle bulk and tone are normal. Muscle strength is normal.  REFLEXES: Reflexes are hypoactive and symmetric    SENSORY: Intact to light touch, pinprick and vibratory sensation are intact in fingers and toes.  COORDINATION: There is no trunk or limb dysmetria noted.  GAIT/STANCE: Posture is normal. Gait is cautious  REVIEW OF SYSTEMS:  Full 14 system review of systems performed and notable only for as above All other review of systems were negative.   ALLERGIES: Allergies  Allergen Reactions   Alendronate Sodium     Other reaction(s): diarrrhea, constipation, difficulty swallowing   Atorvastatin     Other reaction(s): myalgia   Codeine     Other reaction(s): Unknown Other reaction(s): Unknown   Denosumab     Chills and nausea  Other reaction(s): nausea and chills   Ezetimibe     Other reaction(s): fatigue   Oxycodone Nausea And Vomiting   Simvastatin     Other reaction(s): myalgia Other reaction(s): myalgia    HOME MEDICATIONS: Current Outpatient Medications  Medication Sig Dispense Refill   Apoaequorin (PREVAGEN PO) Take by mouth daily.     Biotin 10 MG TABS Take 1 tablet daily by mouth.     Calcium Carb-Cholecalciferol 600-800 MG-UNIT TABS Take 1 tablet daily by mouth.     cholecalciferol (VITAMIN D3) 25 MCG (1000 UNIT) tablet 1 tablet     clobetasol ointment (TEMOVATE) 0.05 % SMARTSIG:Sparingly Topical PRN     fexofenadine (ALLEGRA) 180 MG tablet See admin instructions.     levothyroxine (SYNTHROID) 75 MCG tablet Take 1 tablet by mouth every other day.     levothyroxine (SYNTHROID, LEVOTHROID) 88 MCG tablet Take 88 mcg by mouth every other day. MON, WED, FRIDAY     Multiple Vitamins-Minerals (PRESERVISION AREDS 2) CAPS Take 2 capsules by mouth daily.     Polyethyl Glycol-Propyl Glycol 0.4-0.3 % SOLN Place 1 drop into both eyes daily as needed (for dry eyes).      gabapentin (NEURONTIN) 100 MG capsule Take 200 mg by mouth 2 (two) times daily. (Patient not taking: Reported on 09/27/2021)  5   No current facility-administered medications for this visit.    PAST MEDICAL HISTORY: Past Medical History:  Diagnosis Date   Arthritis    Left hip   Cataracts, bilateral    Followed by Dr. Cordelia Pen & Dr. Manson Passey @ [facility] ophthalmology.   Chronic low back pain    DOE (dyspnea on exertion) 2013   A) 2013-nonischemic Myoview; January 2019-CPX  - Normal.  Echo 01/2017 - Normal   Esophageal reflux    GERD (gastroesophageal reflux disease)    H/O scoliosis    Hypercholesterolemia    Hypothyroidism    Insomnia    Lichen sclerosus    Memory difficulty    Mild aortic stenosis by prior echocardiogram    Echo 04/13/2020: Mild aortic valve stenosis with mean gradient of 8.3 mmHg   PONV (postoperative nausea and vomiting)    Tortuous colon    UTI (lower  urinary tract infection)    Varicose veins of both lower extremities with pain    h/x varicose veins - with endoscopic vein ablation    PAST SURGICAL HISTORY: Past Surgical History:  Procedure Laterality Date   CARDIOPULMONARY EXERCISE TEST  06/20/17   : Relatively unremarkable.  Excellent exercise tolerance.   CATARACT EXTRACTION Bilateral 2016   Left-sided surgery was unsuccessful.   CHOLECYSTECTOMY  1976   CORONARY CALCIUM SCORE  04/17/2020   Calcium score 528, moderate stenosis of CAD-RADS 3.  CT FFR evaluation of Left Main disease was 0.97.  LAD was 0.90, LCx 0.96 and RCA 0.92.  (All nonsignificant nonobstructive).  Mild calcified plaque in the proximal LAD 25 and 50%.  Aortic atherosclerosis noted.   CORONARY CT ANGIOGRAM  11/20/2016   Coronary Calcium Score 367 (HIGH). <30% calcified proximal LAD, otherwise no significant disease.  Calcification of the Aortic Valve base near Left Main.  No significant noncardiac findings   ENDOSCOPIC VEIN LASER TREATMENT     EYE MUSCLE SURGERY Left    FOOT NEUROMA  SURGERY Right    NM MYOVIEW LTD  02/2012   No evidence of ischemia or infarction. Normal ejection fraction.   TONSILLECTOMY AND ADENOIDECTOMY  1938   TOTAL HIP ARTHROPLASTY Left 2000   Left   TOTAL KNEE ARTHROPLASTY Right 11/17/2012   Procedure: RIGHT TOTAL KNEE ARTHROPLASTY;  Surgeon: Mauri Pole, MD;  Location: [facility];  Service: Orthopedics;  Laterality: Right;   TRANSTHORACIC ECHOCARDIOGRAM  01/14/2017    EF 55 to 60%.  GR 1 DD.  Aortic valve sclerosis with mild AI.  Mild TR.   TRANSTHORACIC ECHOCARDIOGRAM  04/13/2020   EF 60 to 70%.  Mild LVH with GR 1 DD.  Trivial small pericardial effusion, mild aortic valve stenosis with mean gradient of 8.3 mmHg    FAMILY HISTORY: Family History  Problem Relation Age of Onset   Heart disease Father        Later onset CAD and heart failure   Cancer Mother        Liver and pancreas   Hypertension Sister    Hyperlipidemia Brother     SOCIAL HISTORY: Social History   Socioeconomic History   Marital status: Widowed    Spouse name: Winfred   Number of children: 4   Years of education: 12   Highest education level: Not on file  Occupational History    Employer: RETIRED    Comment: [employer]  Tobacco Use   Smoking status: Never   Smokeless tobacco: Never  Substance and Sexual Activity   Alcohol use: No   Drug use: No   Sexual activity: Not Currently  Other Topics Concern   Not on file  Social History Narrative   Patient lives at home alone. She is a widow - her husband Morey Hummingbird) died in 06/20/14   High school education.  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   Caffeine- one cup coffee daily.   - She did have prolonged secondhand smoke exposure from her first husband.   One child deceased -> he died of overdose of prescription medications   3 remaining sons aged 89, 54 and 68-> Also one stepchild.    As of 2018, she she had a total  of 7 grandchildren of her home with three-step grandchildren. 10+ great-grandchildren.   Social Determinants of Health   Financial Resource Strain: Not on file  Food Insecurity: Not on file  Transportation Needs: Not on file  Physical Activity: Not on file  Stress: Not on file  Social Connections: Not on file  Intimate Partner Violence: Not on file      Marcial Pacas, M.D. Ph.D.  [facility] [address] [place], [place] [address] Ph: [phone] Fax: [phone]  CC:  Antony Contras, MD [address] [place],  [place] [address]  Antony Contras, MD

## 2021-09-28 LAB — COMPREHENSIVE METABOLIC PANEL
ALT: 18 IU/L (ref 0–32)
AST: 24 IU/L (ref 0–40)
Albumin/Globulin Ratio: 1.8 (ref 1.2–2.2)
Albumin: 4.6 g/dL (ref 3.6–4.6)
Alkaline Phosphatase: 70 IU/L (ref 44–121)
BUN/Creatinine Ratio: 16 (ref 12–28)
BUN: 11 mg/dL (ref 8–27)
Bilirubin Total: 0.5 mg/dL (ref 0.0–1.2)
CO2: 21 mmol/L (ref 20–29)
Calcium: 9.9 mg/dL (ref 8.7–10.3)
Chloride: 97 mmol/L (ref 96–106)
Creatinine, Ser: 0.67 mg/dL (ref 0.57–1.00)
Globulin, Total: 2.5 g/dL (ref 1.5–4.5)
Glucose: 93 mg/dL (ref 70–99)
Potassium: 5.4 mmol/L — ABNORMAL HIGH (ref 3.5–5.2)
Sodium: 136 mmol/L (ref 134–144)
Total Protein: 7.1 g/dL (ref 6.0–8.5)
eGFR: 84 mL/min/{1.73_m2} (ref >59)

## 2021-09-28 LAB — CBC WITH DIFFERENTIAL/PLATELET
Basophils Absolute: 0 10*3/uL (ref 0.0–0.2)
Basos: 1 %
EOS (ABSOLUTE): 0.1 10*3/uL (ref 0.0–0.4)
Eos: 1 %
Hematocrit: 44.3 % (ref 34.0–46.6)
Hemoglobin: 14.9 g/dL (ref 11.1–15.9)
Immature Grans (Abs): 0 10*3/uL (ref 0.0–0.1)
Immature Granulocytes: 0 %
Lymphocytes Absolute: 1.2 10*3/uL (ref 0.7–3.1)
Lymphs: 19 %
MCH: 28.8 pg (ref 26.6–33.0)
MCHC: 33.6 g/dL (ref 31.5–35.7)
MCV: 86 fL (ref 79–97)
Monocytes Absolute: 0.6 10*3/uL (ref 0.1–0.9)
Monocytes: 9 %
Neutrophils Absolute: 4.3 10*3/uL (ref 1.4–7.0)
Neutrophils: 70 %
Platelets: 151 10*3/uL (ref 150–450)
RBC: 5.18 x10E6/uL (ref 3.77–5.28)
RDW: 13.3 % (ref 11.7–15.4)
WBC: 6.1 10*3/uL (ref 3.4–10.8)

## 2021-09-28 LAB — TSH: TSH: 2.84 u[IU]/mL (ref 0.450–4.500)

## 2021-09-28 LAB — RPR: RPR Ser Ql: NONREACTIVE

## 2021-09-28 LAB — FERRITIN: Ferritin: 136 ng/mL (ref 15–150)

## 2021-09-28 LAB — VITAMIN B12: Vitamin B-12: 911 pg/mL (ref 232–1245)

## 2021-10-01 ENCOUNTER — Telehealth: Payer: Self-pay | Admitting: *Deleted

## 2021-10-01 NOTE — Telephone Encounter (Signed)
Left patient a detailed message, with results, on voicemail (ok per DPR).  Provided our number to call back with any questions.  

## 2021-10-01 NOTE — Telephone Encounter (Signed)
-----   Message from Marcial Pacas, MD sent at 10/01/2021  4:44 PM EDT ----- Please call patient, laboratory evaluation showed no significant abnormalities, the slight elevated potassium level 5.4, can due to delayed handling of the blood sample.

## 2021-10-15 ENCOUNTER — Encounter: Payer: Self-pay | Admitting: Podiatry

## 2021-10-15 ENCOUNTER — Ambulatory Visit (INDEPENDENT_AMBULATORY_CARE_PROVIDER_SITE_OTHER): Payer: Medicare Other | Admitting: Podiatry

## 2021-10-15 DIAGNOSIS — M2042 Other hammer toe(s) (acquired), left foot: Secondary | ICD-10-CM

## 2021-10-15 DIAGNOSIS — L89891 Pressure ulcer of other site, stage 1: Secondary | ICD-10-CM

## 2021-10-15 DIAGNOSIS — M2041 Other hammer toe(s) (acquired), right foot: Secondary | ICD-10-CM

## 2021-10-15 DIAGNOSIS — M79675 Pain in left toe(s): Secondary | ICD-10-CM | POA: Diagnosis not present

## 2021-10-15 DIAGNOSIS — B351 Tinea unguium: Secondary | ICD-10-CM | POA: Diagnosis not present

## 2021-10-15 DIAGNOSIS — M79674 Pain in right toe(s): Secondary | ICD-10-CM | POA: Diagnosis not present

## 2021-10-20 NOTE — Progress Notes (Signed)
  Subjective:  Patient ID: Summer Davis, female    DOB: 06/07/1930,  MRN: [id number]  Summer Davis presents to clinic today for painful elongated mycotic toenails 1-5 bilaterally which are tender when wearing enclosed shoe gear. Pain is relieved with periodic professional debridement.  New problem(s):  Chief concern of painful right 2nd digit. She has pain located interdigitally due to pressure from right great toe. Denies any breaks in skin, but area of pressure is very tender.  PCP is Tally Joe, MD , and last visit was August 07, 2021.  Allergies  Allergen Reactions   Alendronate Sodium     Other reaction(s): diarrrhea, constipation, difficulty swallowing   Atorvastatin     Other reaction(s): myalgia   Codeine     Other reaction(s): Unknown Other reaction(s): Unknown   Denosumab     Chills and nausea  Other reaction(s): nausea and chills   Ezetimibe     Other reaction(s): fatigue   Oxycodone Nausea And Vomiting   Simvastatin     Other reaction(s): myalgia Other reaction(s): myalgia    Review of Systems: Negative except as noted in the HPI.  Objective: No changes noted in today's physical examination. Constitutional Renalda ROBI MITTER is a pleasant 88 y.o. Caucasian female, WD, WN in NAD. AAO x 3.   Vascular CFT <3 seconds b/l LE. Palpable DP/PT pulses b/l LE. Digital hair absent b/l. Skin temperature gradient WNL b/l. No pain with calf compression b/l. No edema noted b/l. No cyanosis or clubbing noted b/l LE. Varicosities present b/l. No cyanosis or clubbing noted.  Neurologic Normal speech. Oriented to person, place, and time. Protective sensation intact 5/5 intact bilaterally with 10g monofilament b/l. Vibratory sensation intact b/l.  Dermatologic Pedal skin thin, shiny and atrophic b/l LE. No open wounds b/l LE. No interdigital macerations noted b/l LE. Toenails 1-5 b/l elongated, discolored, dystrophic, thickened, crumbly with subungual debris and tenderness to  dorsal palpation. Right 2nd digit with pressure noted at medial DIPJ with blanchable erythema. No edema, no fluctuance.  Orthopedic: Normal muscle strength 5/5 to all lower extremity muscle groups bilaterally. No pain, crepitus or joint limitation noted with ROM b/l LE.HAV with bunion bilaterally and hammertoes 2-5 b/l.Patient ambulates independently without assistive aids.   Radiographs: None  Assessment/Plan: 1. Pain due to onychomycosis of toenails of both feet   2. Pressure injury of toe of right foot, stage 1   3. Pain in right toe(s)   4. Acquired hammertoes of both feet     -Patient was evaluated and treated. All patient's and/or POA's questions/concerns answered on today's visit. -Patient to continue soft, supportive shoe gear daily. -Mycotic toenails 1-5 bilaterally were debrided in length and girth with sterile nail nippers and dremel without incident. -Dispensed padding supplies to patient. Apply to R 2nd toe every morning. Remove every evening. -Patient/POA to call should there be question/concern in the interim.   Return in about 9 weeks (around 12/17/2021).  Freddie Breech, DPM

## 2021-11-22 DIAGNOSIS — S0511XA Contusion of eyeball and orbital tissues, right eye, initial encounter: Secondary | ICD-10-CM | POA: Insufficient documentation

## 2021-11-29 ENCOUNTER — Ambulatory Visit (INDEPENDENT_AMBULATORY_CARE_PROVIDER_SITE_OTHER): Payer: Medicare Other | Admitting: Internal Medicine

## 2021-11-29 ENCOUNTER — Telehealth: Payer: Self-pay | Admitting: Pharmacy Technician

## 2021-11-29 ENCOUNTER — Encounter: Payer: Self-pay | Admitting: Internal Medicine

## 2021-11-29 VITALS — BP 122/70 | HR 82 | Ht 61.5 in | Wt 106.0 lb

## 2021-11-29 DIAGNOSIS — M810 Age-related osteoporosis without current pathological fracture: Secondary | ICD-10-CM

## 2021-11-29 NOTE — Telephone Encounter (Signed)
Auth Submission: no auth needed Payer: medicare a/b & aarp Medication & CPT/J Code(s) submitted: Reclast (Zolendronic acid) [id number] Route of submission (phone, fax, portal): phone: [phone] Auth type: Buy/Bill Units/visits requested: x1 dose Reference number: [id number] Approval from: 11/29/21 to 05/12/22

## 2021-11-29 NOTE — Progress Notes (Signed)
Name: Summer Davis  MRN/ DOB: [id number], 1931/04/18    Age/ Sex: 88 y.o., female    PCP: Tally Joe, MD   Reason for Endocrinology Evaluation: Osteoporosis      Date of Initial Endocrinology Evaluation: 12/08/2020    HPI: Summer Davis is a 88 y.o. female with a past medical history of Osteoporosis, GERD, insomnia and hypothyroidism. The patient presented for initial endocrinology clinic visit on 12/08/2020  for consultative assistance with her Osteoporosis .    Pt was diagnosed with osteoporosis: years ago, her last DXA 2021 with a Tscore at the left radius -5.3   Menarche at age : age 41  Menopausal at age : age 54 Fracture Hx: no Hx of HRT: no  FH of osteoporosis or hip fracture: no Prior Hx of anti-estrogenic therapy : Prior Hx of anti-resorptive therapy : Alendronate- diarrhea, constipation and difficulty swallowing , Prolia - nausea and chills  She declined Forteo in 11/2020 due to daily injections  She received her first Reclast infusion 12/26/2020  SUBJECTIVE:   Today (11/29/21): Summer Davis is here for follow-up on osteoporosis.  She received her first Reclast infusion 12/26/2020 She did not have any side effects to reclast  Has chronic back pain  Denies constipation  She had a fall ~2 weeks ago by falling off the bed but no bone fracture   She is on calcium 650 BID  Vitamin D 1000 iu through  [facility]      HISTORY:  Past Medical History:  Past Medical History:  Diagnosis Date   Arthritis    Left hip   Cataracts, bilateral    Followed by Dr. Gwendalyn Ege & Dr. Rudene Christians @ [facility] ophthalmology.   Chronic low back pain    DOE (dyspnea on exertion) 2013   A) 2013-nonischemic Myoview; January 2019-CPX  - Normal.  Echo 01/2017 - Normal   Esophageal reflux    GERD (gastroesophageal reflux disease)    H/O scoliosis    Hypercholesterolemia    Hypothyroidism    Insomnia    Lichen sclerosus    Memory difficulty    Mild aortic stenosis by prior  echocardiogram    Echo 04/13/2020: Mild aortic valve stenosis with mean gradient of 8.3 mmHg   PONV (postoperative nausea and vomiting)    Tortuous colon    UTI (lower urinary tract infection)    Varicose veins of both lower extremities with pain    h/x varicose veins - with endoscopic vein ablation   Past Surgical History:  Past Surgical History:  Procedure Laterality Date   CARDIOPULMONARY EXERCISE TEST  05/2017   : Relatively unremarkable.  Excellent exercise tolerance.   CATARACT EXTRACTION Bilateral 2016   Left-sided surgery was unsuccessful.   CHOLECYSTECTOMY  1976   CORONARY CALCIUM SCORE  04/17/2020   Calcium score 528, moderate stenosis of CAD-RADS 3.  CT FFR evaluation of Left Main disease was 0.97.  LAD was 0.90, LCx 0.96 and RCA 0.92.  (All nonsignificant nonobstructive).  Mild calcified plaque in the proximal LAD 25 and 50%.  Aortic atherosclerosis noted.   CORONARY CT ANGIOGRAM  11/20/2016   Coronary Calcium Score 367 (HIGH). <30% calcified proximal LAD, otherwise no significant disease.  Calcification of the Aortic Valve base near Left Main.  No significant noncardiac findings   ENDOSCOPIC VEIN LASER TREATMENT     EYE MUSCLE SURGERY Left    FOOT NEUROMA SURGERY Right    NM MYOVIEW LTD  02/2012   No evidence of  ischemia or infarction. Normal ejection fraction.   TONSILLECTOMY AND ADENOIDECTOMY  1938   TOTAL HIP ARTHROPLASTY Left 2000   Left   TOTAL KNEE ARTHROPLASTY Right 11/17/2012   Procedure: RIGHT TOTAL KNEE ARTHROPLASTY;  Surgeon: Mauri Pole, MD;  Location: [facility];  Service: Orthopedics;  Laterality: Right;   TRANSTHORACIC ECHOCARDIOGRAM  01/14/2017    EF 55 to 60%.  GR 1 DD.  Aortic valve sclerosis with mild AI.  Mild TR.   TRANSTHORACIC ECHOCARDIOGRAM  04/13/2020   EF 60 to 70%.  Mild LVH with GR 1 DD.  Trivial small pericardial effusion, mild aortic valve stenosis with mean gradient of 8.3 mmHg    Social History:  reports that she has never smoked. She has  never used smokeless tobacco. She reports that she does not drink alcohol and does not use drugs. Family History: family history includes Cancer in her mother; Heart disease in her father; Hyperlipidemia in her brother; Hypertension in her sister.   HOME MEDICATIONS: Allergies as of 11/29/2021       Reactions   Alendronate Sodium    Other reaction(s): diarrrhea, constipation, difficulty swallowing   Atorvastatin    Other reaction(s): myalgia   Codeine    Other reaction(s): Unknown Other reaction(s): Unknown   Denosumab    Chills and nausea  Other reaction(s): nausea and chills   Ezetimibe    Other reaction(s): fatigue   Oxycodone Nausea And Vomiting   Simvastatin    Other reaction(s): myalgia Other reaction(s): myalgia        Medication List        Accurate as of November 29, 2021  9:22 AM. If you have any questions, ask your nurse or doctor.          Biotin 10 MG Tabs Take 1 tablet daily by mouth.   Calcium Carb-Cholecalciferol 600-800 MG-UNIT Tabs Take 1 tablet daily by mouth.   cholecalciferol 25 MCG (1000 UNIT) tablet Commonly known as: VITAMIN D3 1 tablet   clobetasol ointment 0.05 % Commonly known as: TEMOVATE SMARTSIG:Sparingly Topical PRN   diclofenac Sodium 1 % Gel Commonly known as: VOLTAREN SMARTSIG:2 Gram(s) Topical 4 Times Daily PRN   fexofenadine 180 MG tablet Commonly known as: ALLEGRA See admin instructions.   gabapentin 100 MG capsule Commonly known as: NEURONTIN Take 200 mg by mouth 2 (two) times daily.   levothyroxine 75 MCG tablet Commonly known as: SYNTHROID Take 1 tablet by mouth every other day.   levothyroxine 88 MCG tablet Commonly known as: SYNTHROID Take 88 mcg by mouth every other day. MON, WED, FRIDAY   lidocaine-prilocaine cream Commonly known as: EMLA 1 gram tid prn   Polyethyl Glycol-Propyl Glycol 0.4-0.3 % Soln Place 1 drop into both eyes daily as needed (for dry eyes).   PreserVision AREDS 2 Caps Take 2  capsules by mouth daily.   PREVAGEN PO Take by mouth daily.             OBJECTIVE:  VS: BP 122/70 (BP Location: Left Arm, Patient Position: Sitting, Cuff Size: Small)   Pulse 82   Ht 5' 1.5" (1.562 m)   Wt 106 lb (48.1 kg)   SpO2 99%   BMI 19.70 kg/m    Wt Readings from Last 3 Encounters:  11/29/21 106 lb (48.1 kg)  09/27/21 111 lb (50.3 kg)  12/08/20 112 lb (50.8 kg)     EXAM: General: Pt appears well and is in NAD  Neck: General: Supple without adenopathy. Thyroid: Thyroid size normal.  No goiter or nodules appreciated.   Lungs: Clear with good BS bilat with no rales, rhonchi, or wheezes  Heart: Auscultation: RRR.  Extremities:  BL LE: No pretibial edema normal ROM and strength.  Mental Status: Judgment, insight: Intact Orientation: Oriented to time, place, and person  Mood and affect: No depression, anxiety, or agitation     DATA REVIEWED:   Latest Reference Range & Units 09/27/21 09:30  Sodium 134 - 144 mmol/L 136  Potassium 3.5 - 5.2 mmol/L 5.4 (H)  Chloride 96 - 106 mmol/L 97  CO2 20 - 29 mmol/L 21  Glucose 70 - 99 mg/dL 93  BUN 8 - 27 mg/dL 11  Creatinine 0.57 - 1.00 mg/dL 0.67  Calcium 8.7 - 10.3 mg/dL 9.9  BUN/Creatinine Ratio 12 - 28  16  eGFR >59 mL/min/1.73 84  Alkaline Phosphatase 44 - 121 IU/L 70  Albumin 3.6 - 4.6 g/dL 4.6  Albumin/Globulin Ratio 1.2 - 2.2  1.8  AST 0 - 40 IU/L 24  ALT 0 - 32 IU/L 18  Total Protein 6.0 - 8.5 g/dL 7.1  Total Bilirubin 0.0 - 1.2 mg/dL 0.5    Latest Reference Range & Units 09/27/21 09:30  TSH 0.450 - 4.500 uIU/mL 2.840     Results:2021  Femoral neck (FN) 33% distal radius  T-score RFN: -2.5  -5.3  Change in BMD from previous DXA test (%) from 2018 Down 4% Down 17%  (*) statistically significant    ASSESSMENT/PLAN/RECOMMENDATIONS:   Osteoporosis :  - She is intolerant to alendronate due to GI side effects  - She is intolerant to Prolia due to nausea and chills  - We discussed PTH  analogues such as fortea as I believe this is good option given the severity of osteoporosis at the distal radius but she declines daily injections  - She tolerated her first zoledronic acid injection in 2022 - Will schedule for this year  - We discussed she will need a total of 3 years - Will consider repeating DXA after her 3rd Zoledronic injection   Medications : Calcium 650 mg daily Vitamin D 1000 iu daily through [facility] Continue Zoledronic acid 5 mg annually    F/U in 1 yr   Signed electronically by: Mack Guise, MD  [facility]  [address], [place] [address] Phone: [phone] FAX: [phone]   CC: Antony Contras, MD [address] [place] [address] Phone: [phone] Fax: [phone]   Return to Endocrinology clinic as below: Future Appointments  Date Time Provider [place]  12/17/2021  1:15 PM Marzetta Board, DPM [facility]

## 2021-11-29 NOTE — Patient Instructions (Signed)
-    Calcium 650 mg twice daily  -  multivitamin 1 tablet daily

## 2021-12-11 ENCOUNTER — Ambulatory Visit (INDEPENDENT_AMBULATORY_CARE_PROVIDER_SITE_OTHER): Payer: Medicare Other

## 2021-12-11 VITALS — BP 137/76 | HR 75 | Temp 98.1°F | Resp 16 | Ht 61.0 in | Wt 106.8 lb

## 2021-12-11 DIAGNOSIS — M810 Age-related osteoporosis without current pathological fracture: Secondary | ICD-10-CM

## 2021-12-11 MED ORDER — ACETAMINOPHEN 325 MG PO TABS
650.0000 mg | ORAL_TABLET | Freq: Once | ORAL | Status: AC
  Administered 2021-12-11: 650 mg via ORAL
  Filled 2021-12-11: qty 2

## 2021-12-11 MED ORDER — SODIUM CHLORIDE 0.9 % IV SOLN: INTRAVENOUS | Status: DC

## 2021-12-11 MED ORDER — DIPHENHYDRAMINE HCL 25 MG PO CAPS
25.0000 mg | ORAL_CAPSULE | Freq: Once | ORAL | Status: AC
  Administered 2021-12-11: 25 mg via ORAL
  Filled 2021-12-11: qty 1

## 2021-12-11 MED ORDER — ZOLEDRONIC ACID 5 MG/100ML IV SOLN
5.0000 mg | Freq: Once | INTRAVENOUS | Status: AC
  Administered 2021-12-11: 5 mg via INTRAVENOUS
  Filled 2021-12-11: qty 100

## 2021-12-11 NOTE — Progress Notes (Signed)
Diagnosis: Osteoporosis  Provider:  Chilton Greathouse, MD  Procedure: Infusion  IV Type: Peripheral, IV Location: left wrist   Reclast (Zolendronic Acid), Dose: 5 mg  Infusion Start Time: 1355  Infusion Stop Time: 1430  Post Infusion IV Care: Peripheral IV Discontinued  Discharge: Condition: Good, Destination: Home . AVS provided to patient.   Performed by:  Loney Hering, LPN

## 2021-12-17 ENCOUNTER — Ambulatory Visit (INDEPENDENT_AMBULATORY_CARE_PROVIDER_SITE_OTHER): Payer: Medicare Other | Admitting: Podiatry

## 2021-12-17 DIAGNOSIS — M79675 Pain in left toe(s): Secondary | ICD-10-CM

## 2021-12-17 DIAGNOSIS — B351 Tinea unguium: Secondary | ICD-10-CM

## 2021-12-17 DIAGNOSIS — M2012 Hallux valgus (acquired), left foot: Secondary | ICD-10-CM | POA: Diagnosis not present

## 2021-12-17 DIAGNOSIS — L89891 Pressure ulcer of other site, stage 1: Secondary | ICD-10-CM

## 2021-12-17 DIAGNOSIS — M79674 Pain in right toe(s): Secondary | ICD-10-CM | POA: Diagnosis not present

## 2021-12-17 DIAGNOSIS — M2011 Hallux valgus (acquired), right foot: Secondary | ICD-10-CM

## 2021-12-23 ENCOUNTER — Encounter: Payer: Self-pay | Admitting: Podiatry

## 2021-12-23 NOTE — Progress Notes (Signed)
  Subjective:  Patient ID: Summer Davis, female    DOB: [birth year],  MRN: [id number]  Summer Davis presents to clinic today for painful thick toenails that are difficult to trim. Pain interferes with ambulation. Aggravating factors include wearing enclosed shoe gear. Pain is relieved with periodic professional debridement.  She also relates chronic pain of right 2nd digit from abutting left great toe. She has tried varius padding techniques, but pressure/pain still persists. She would like to discuss other options available for treatment.  PCP is Tally Joe, MD , and last visit was  November 14, 2021  Allergies  Allergen Reactions   Alendronate Sodium     Other reaction(s): diarrrhea, constipation, difficulty swallowing   Atorvastatin     Other reaction(s): myalgia   Codeine     Other reaction(s): Unknown Other reaction(s): Unknown   Denosumab     Chills and nausea  Other reaction(s): nausea and chills   Ezetimibe     Other reaction(s): fatigue   Oxycodone Nausea And Vomiting   Simvastatin     Other reaction(s): myalgia Other reaction(s): myalgia    Review of Systems: Negative except as noted in the HPI.  Objective: No changes noted in today's physical examination. Constitutional Summer Davis is a pleasant 88 y.o. Caucasian female, WD, WN in NAD. AAO x 3.   Vascular CFT <3 seconds b/l LE. Palpable DP/PT pulses b/l LE. Digital hair absent b/l. Skin temperature gradient WNL b/l. No pain with calf compression b/l. No edema noted b/l. No cyanosis or clubbing noted b/l LE. Varicosities present b/l. No cyanosis or clubbing noted.  Neurologic Normal speech. Oriented to person, place, and time. Protective sensation intact 5/5 intact bilaterally with 10g monofilament b/l. Vibratory sensation intact b/l.  Dermatologic Pedal skin thin, shiny and atrophic b/l LE. No open wounds b/l LE. No interdigital macerations noted b/l LE. Toenails 1-5 b/l elongated, discolored, dystrophic,  thickened, crumbly with subungual debris and tenderness to dorsal palpation. Right 2nd digit with pressure noted at medial DIPJ with blanchable erythema. No edema, no fluctuance.  Orthopedic: Normal muscle strength 5/5 to all lower extremity muscle groups bilaterally. No pain, crepitus or joint limitation noted with ROM b/l LE.HAV with bunion bilaterally and hammertoes 2-5 b/l. Patient ambulates independently without assistive aids.   Radiographs: None  Assessment/Plan: 1. Pain due to onychomycosis of toenails of both feet   2. Pressure injury of toe of right foot, stage 1   3. Pain in right toe(s)   4. Hallux valgus, acquired, bilateral     -Examined patient. -Toenails 1-5 b/l were debrided in length and girth with sterile nail nippers and dremel without iatrogenic bleeding.  -Patient referred to Dr. Sharl Ma for evaluation of bunion and chronic pain right 2nd digit. -Continue padding to right 2nd digit daily for protection. -Patient/POA to call should there be question/concern in the interim.   Return in about 3 months (around 03/19/2022).  Freddie Breech, DPM

## 2022-01-07 ENCOUNTER — Ambulatory Visit (INDEPENDENT_AMBULATORY_CARE_PROVIDER_SITE_OTHER): Payer: Medicare Other

## 2022-01-07 ENCOUNTER — Ambulatory Visit (INDEPENDENT_AMBULATORY_CARE_PROVIDER_SITE_OTHER): Payer: Medicare Other | Admitting: Podiatry

## 2022-01-07 DIAGNOSIS — M21611 Bunion of right foot: Secondary | ICD-10-CM

## 2022-01-07 DIAGNOSIS — M2041 Other hammer toe(s) (acquired), right foot: Secondary | ICD-10-CM | POA: Diagnosis not present

## 2022-01-07 DIAGNOSIS — M2011 Hallux valgus (acquired), right foot: Secondary | ICD-10-CM | POA: Diagnosis not present

## 2022-01-08 NOTE — Progress Notes (Signed)
  Subjective:  Patient ID: Summer Davis, female    DOB: 01/13/1931,  MRN: [id number]  Chief Complaint  Patient presents with   Foot Problem    painful right 2nd hammertoe- getting worst. Patient using spacer in between toes.    88 y.o. female presents with the above complaint. History confirmed with patient.  Currently the spacer is helpful she has relatively minimal pain in the toe itself.  Objective:  Physical Exam: warm, good capillary refill, no trophic changes or ulcerative lesions, normal DP and PT pulses, normal sensory exam, and hallux valgus deformity on the right foot with under lapping hallux over the second toe, mild hyperkeratosis medial second toe.   Radiographs: Multiple views x-ray of right foot : Moderate osteoporosis, degenerative changes in the first MTPJ, she has moderate severe hallux valgus deformity with underlapping of the hallux under the second toe Assessment:   1. Hammertoe of right foot      Plan:  Patient was evaluated and treated and all questions answered.  We discussed etiology and treatment options of hammertoe and hallux valgus deformity, currently her skin abrasion is well-healed and she is able to offload this with a toe spacer.  Does not hurt itself.  We discussed surgical correction of this.  At her age I think the only indication for surgery would be partial toe amputation if it worsens and has impending infection or severely painful for her and limiting for her daily activities.  She return as needed to see me if this worsens and is interested in surgical correction No follow-ups on file.

## 2022-01-15 ENCOUNTER — Telehealth: Payer: Self-pay | Admitting: *Deleted

## 2022-01-15 NOTE — Patient Outreach (Signed)
  Care Coordination   Initial Visit Note   01/15/2022 Name: Summer Davis MRN: [id number] DOB: 09-12-1930  Summer Davis is a 88 y.o. year old female who sees Tally Joe, MD for primary care. I spoke with  Bethann Punches by phone today.  What matters to the patients health and wellness today?  Declines need for Care Coordination services.     Goals Addressed   None     SDOH assessments and interventions completed:  No     Care Coordination Interventions Activated:  No  Care Coordination Interventions:  No, not indicated   Follow up plan: No further intervention required.   Encounter Outcome:  Pt. Refused   Reece Levy MSW, LCSW Licensed Clinical Social Worker      [phone]

## 2022-03-20 ENCOUNTER — Encounter: Payer: Self-pay | Admitting: Podiatry

## 2022-03-20 ENCOUNTER — Ambulatory Visit (INDEPENDENT_AMBULATORY_CARE_PROVIDER_SITE_OTHER): Payer: Medicare Other | Admitting: Podiatry

## 2022-03-20 DIAGNOSIS — M79675 Pain in left toe(s): Secondary | ICD-10-CM

## 2022-03-20 DIAGNOSIS — M79674 Pain in right toe(s): Secondary | ICD-10-CM

## 2022-03-20 DIAGNOSIS — B351 Tinea unguium: Secondary | ICD-10-CM | POA: Diagnosis not present

## 2022-03-20 NOTE — Progress Notes (Signed)
  Subjective:  Patient ID: Summer Davis, female    DOB: 1930/06/21,  MRN: [id number]  Summer Davis presents to clinic today for painful elongated mycotic toenails 1-5 bilaterally which are tender when wearing enclosed shoe gear. Pain is relieved with periodic professional debridement.  She also has painful right 2nd digit due to pressure from left great toe. She has been wearing padding daily and this minimizes pressure. She did see Dr. Lilian Kapur who discussed surgical options. She would like to continue padding for now. Chief Complaint  Patient presents with   Nail Problem    Nail Trim  Not Diabetic  PCP - Dr Azucena Cecil   New problem(s): None.   PCP is Tally Joe, MD.  Allergies  Allergen Reactions   Alendronate Sodium     Other reaction(s): diarrrhea, constipation, difficulty swallowing   Atorvastatin     Other reaction(s): myalgia   Codeine     Other reaction(s): Unknown Other reaction(s): Unknown   Denosumab     Chills and nausea  Other reaction(s): nausea and chills   Ezetimibe     Other reaction(s): fatigue   Oxycodone Nausea And Vomiting   Simvastatin     Other reaction(s): myalgia Other reaction(s): myalgia    Review of Systems: Negative except as noted in the HPI.  Objective: No changes noted in today's physical examination.  Summer Davis is a pleasant 88 y.o. female WD, WN in NAD. AAO x 3. Vascular CFT <3 seconds b/l LE. Palpable DP/PT pulses b/l LE. Digital hair absent b/l. Skin temperature gradient WNL b/l. No pain with calf compression b/l. No edema noted b/l. No cyanosis or clubbing noted b/l LE. Varicosities present b/l. No cyanosis or clubbing noted.  Neurologic Normal speech. Oriented to person, place, and time. Protective sensation intact 5/5 intact bilaterally with 10g monofilament b/l. Vibratory sensation intact b/l.  Dermatologic Pedal skin thin, shiny and atrophic b/l LE. No open wounds b/l LE. No interdigital macerations noted b/l LE.  Toenails 1-5 b/l elongated, discolored, dystrophic, thickened, crumbly with subungual debris and tenderness to dorsal palpation. Right 2nd digit with pressure noted at medial DIPJ with blanchable erythema. No edema, no fluctuance.  Orthopedic: Normal muscle strength 5/5 to all lower extremity muscle groups bilaterally. No pain, crepitus or joint limitation noted with ROM b/l LE.HAV with bunion bilaterally and hammertoes 2-5 b/l. Patient ambulates independently without assistive aids.   Radiographs: None Assessment/Plan: 1. Pain due to onychomycosis of toenails of both feet     No orders of the defined types were placed in this encounter.   -Consent given for treatment as described below: -Mycotic toenails 1-5 bilaterally were debrided in length and girth with sterile nail nippers and dremel without incident. -Continue padding to afftected digits daily for protection. -Patient/POA to call should there be question/concern in the interim.   Return in about 3 months (around 06/20/2022).  Freddie Breech, DPM

## 2022-04-30 IMAGING — CT CT HEAD W/O CM
3 series · 15 of 47 positions shown, 18 images · non-contrast
Comparison: 10/29/2015

CLINICAL DATA: Fall with posterior head injury

EXAM:
CT HEAD WITHOUT CONTRAST
CT CERVICAL SPINE WITHOUT CONTRAST
TECHNIQUE: Multidetector CT imaging of the head and cervical spine was
performed following the standard protocol without intravenous
contrast. Multiplanar CT image reconstructions of the cervical spine
were also generated.

[Series 2: head wo · axial · 0.44mm/px · z∈[-124,+16]mm · 9 of 34 slices shown, 12 images]
[im 3/34  brain]
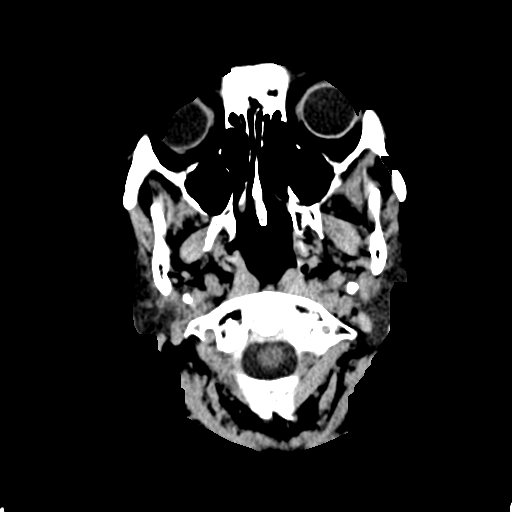
[im 3/34  bone]
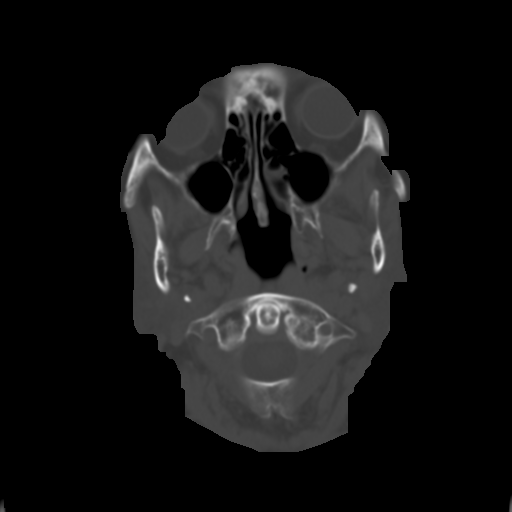
[im 6/34  brain]
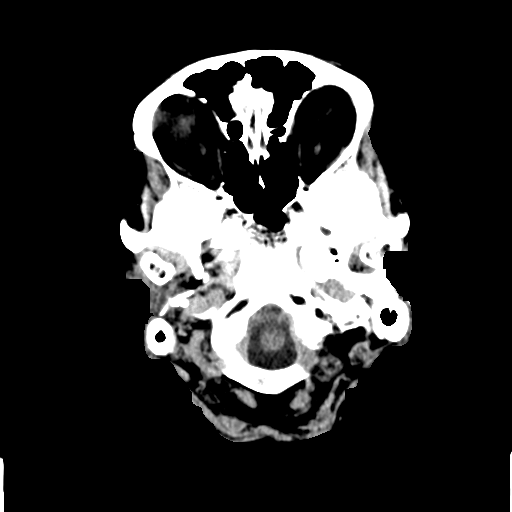
[im 10/34  brain]
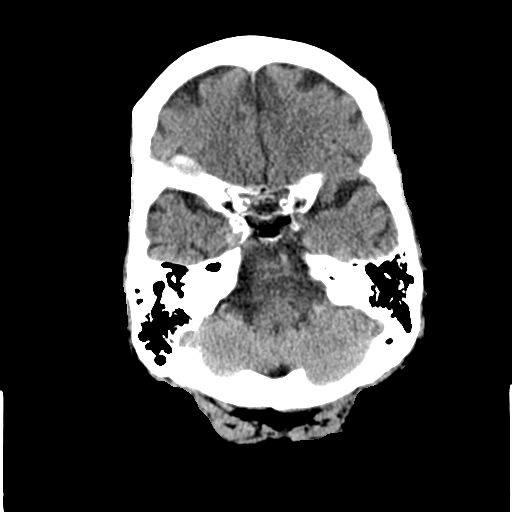
[im 13/34  brain]
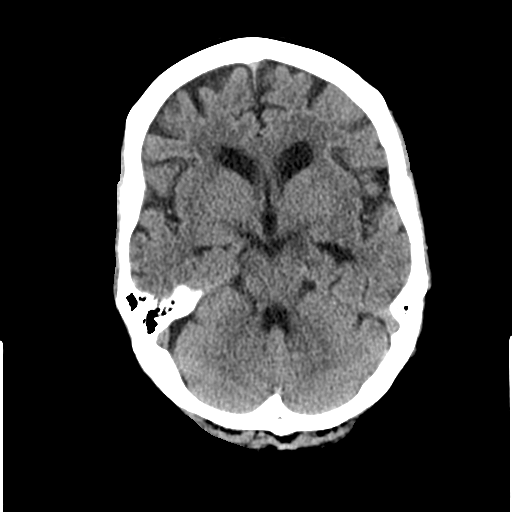
[im 18/34  brain]
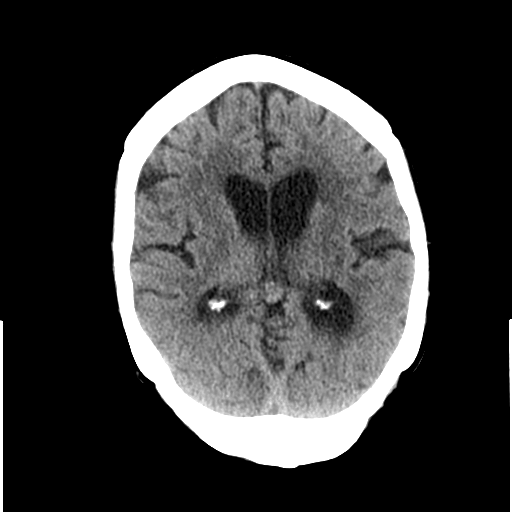
[im 18/34  bone]
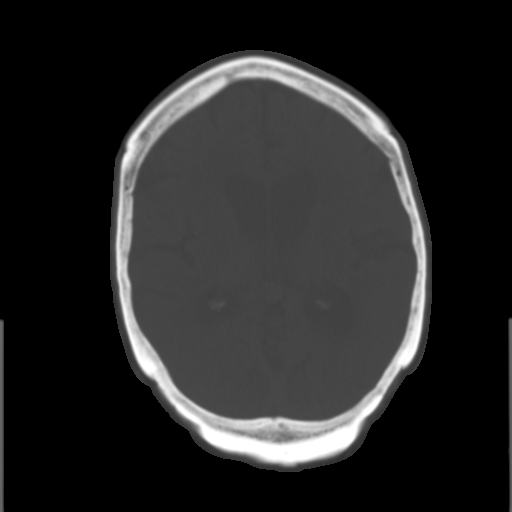
[im 21/34  brain]
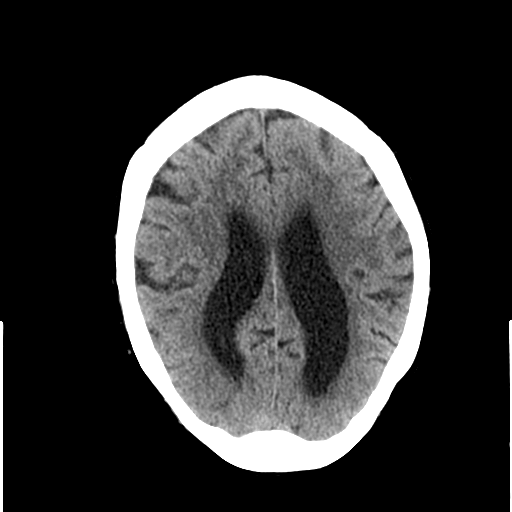
[im 24/34  brain]
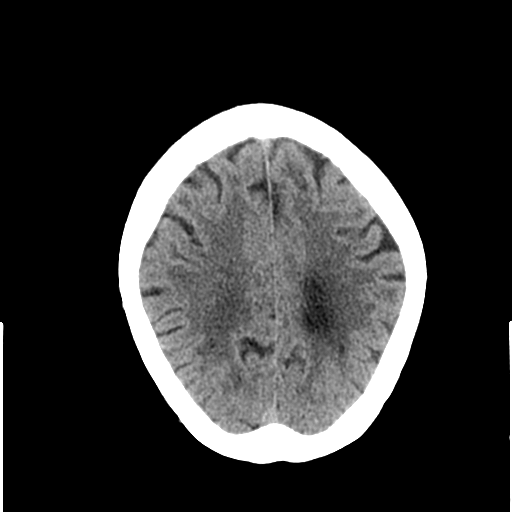
[im 28/34  brain]
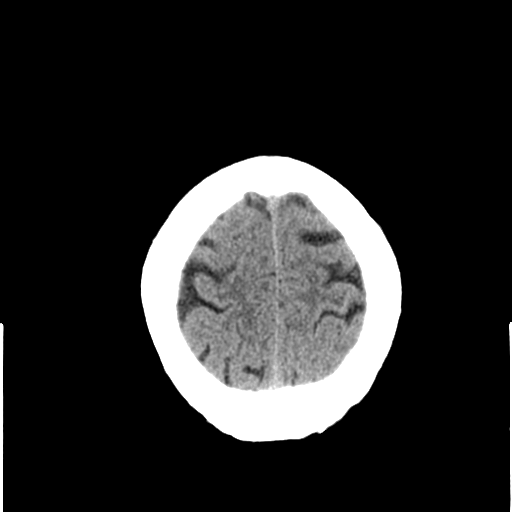
[im 31/34  brain]
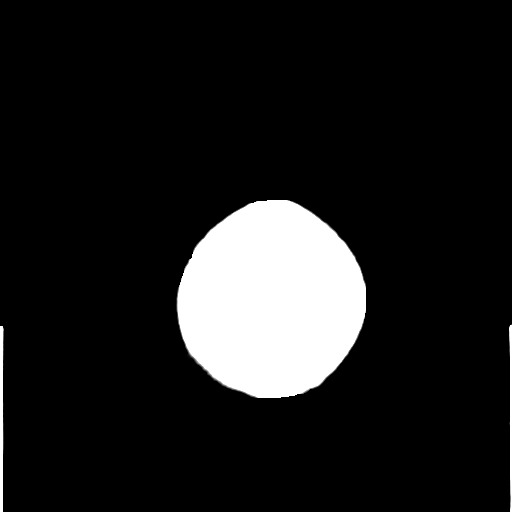
[im 31/34  bone]
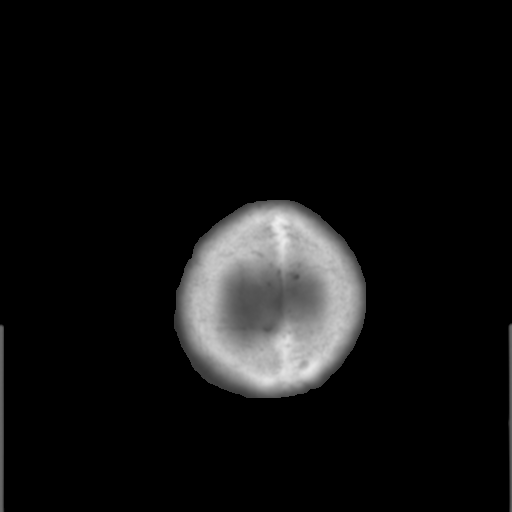

[Series 4: coronal soft tissue · coronal · 0.31mm/px · 3 of 72 slices shown]
[im 24/72  brain]
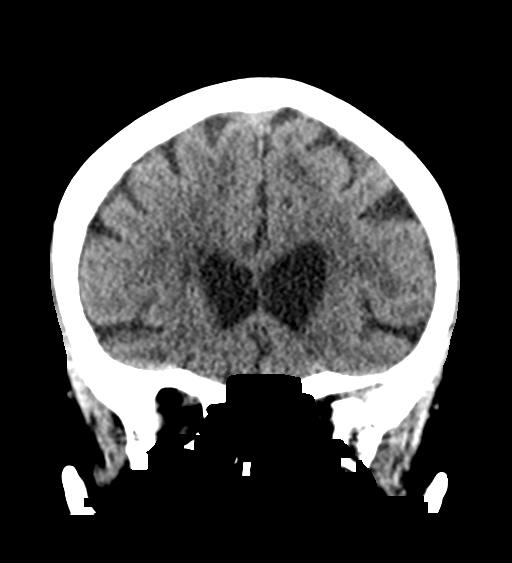
[im 32/72  brain]
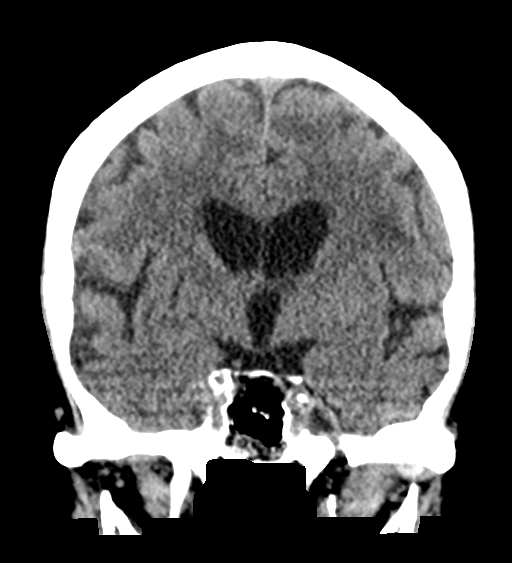
[im 40/72  brain]
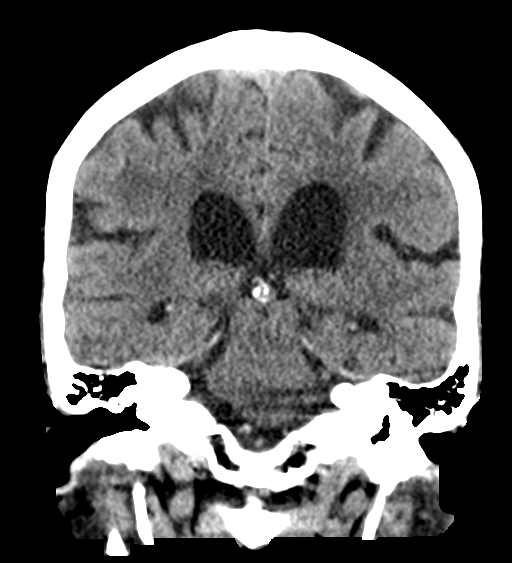

[Series 5: sagittal soft tissue · sagittal · 0.35mm/px · 3 of 54 slices shown]
[im 18/54  brain]
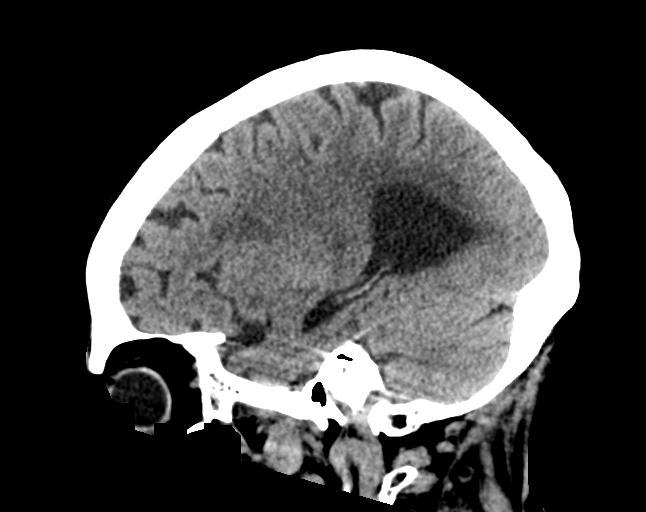
[im 27/54  brain]
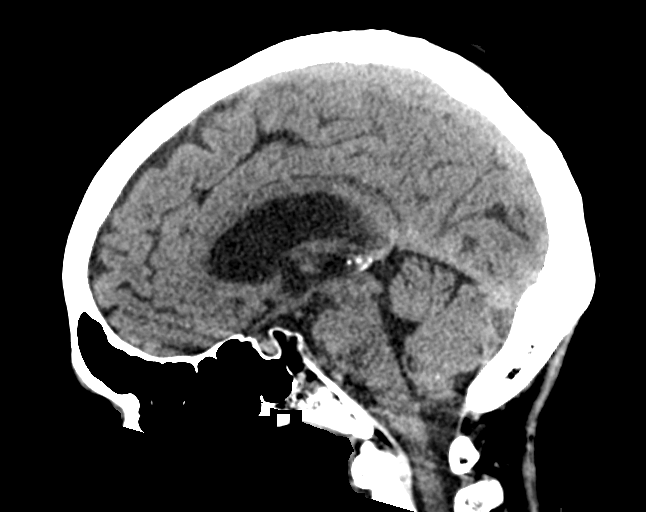
[im 36/54  brain]
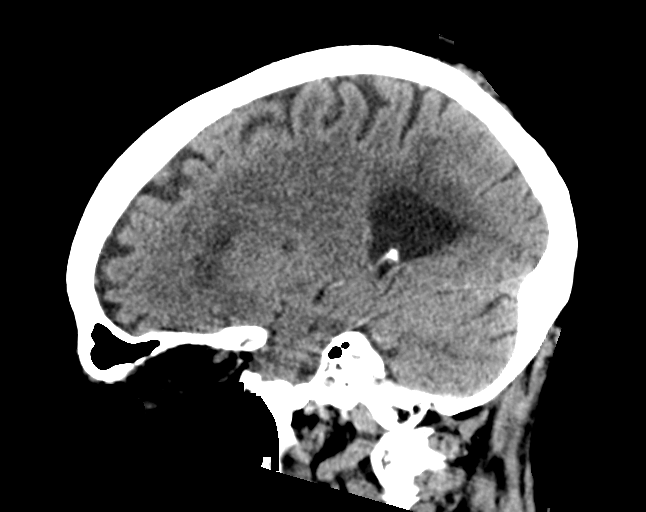

[15 of 47 positions shown; findings below may reference images not displayed]

FINDINGS: CT HEAD FINDINGS

Brain: No evidence of acute infarction, hemorrhage, hydrocephalus,
extra-axial collection or mass lesion/mass effect. Mild low-density
changes within the periventricular and subcortical white matter
compatible with chronic microvascular ischemic change. Mild diffuse
cerebral volume loss.

Vascular: Atherosclerotic calcifications involving the large vessels
of the skull base. No unexpected hyperdense vessel.

Skull: Normal. Negative for fracture or focal lesion.

Sinuses/Orbits: No acute finding.

Other: Mild soft tissue swelling in posterior right parietal region
without focal scalp hematoma.

CT CERVICAL SPINE FINDINGS

Alignment: Facet joints are aligned without dislocation or traumatic
listhesis. Dens and lateral masses are aligned. Straightening with
slight reversal of the cervical lordosis.

Skull base and vertebrae: No acute fracture. No primary bone lesion
or focal pathologic process.

Soft tissues and spinal canal: No prevertebral fluid or swelling. No
visible canal hematoma.

Disc levels: Intervertebral disc height loss at and endplate
spurring most pronounced at C4-5, C5-6, and C6-7. Multilevel
bilateral facet arthropathy. Overall, findings are similar to the
previous CT.

Upper chest: Biapical pleuroparenchymal scarring.

Other: None.
IMPRESSION: 1. No acute intracranial findings.
2. No evidence of acute fracture or traumatic listhesis of the
cervical spine.
3. Multilevel cervical spondylosis, similar to prior.

## 2022-05-28 ENCOUNTER — Encounter: Payer: Self-pay | Admitting: Emergency Medicine

## 2022-05-28 ENCOUNTER — Ambulatory Visit (INDEPENDENT_AMBULATORY_CARE_PROVIDER_SITE_OTHER): Payer: Medicare Other | Admitting: Emergency Medicine

## 2022-05-28 VITALS — BP 120/70 | HR 88 | Temp 97.6°F | Ht 61.0 in | Wt 113.6 lb

## 2022-05-28 DIAGNOSIS — J3489 Other specified disorders of nose and nasal sinuses: Secondary | ICD-10-CM

## 2022-05-28 DIAGNOSIS — R0609 Other forms of dyspnea: Secondary | ICD-10-CM

## 2022-05-28 MED ORDER — IPRATROPIUM BROMIDE 0.03 % NA SOLN: 2.0000 | Freq: Two times a day (BID) | NASAL | 12 refills | Status: DC

## 2022-05-28 NOTE — Addendum Note (Signed)
Addended by: Lorretta Harp on: 05/28/2022 03:59 PM   Modules accepted: Orders

## 2022-05-28 NOTE — Assessment & Plan Note (Signed)
Clear continuous drainage happening during the day, not at night that started after she suffered a fall with facial impact and nasal and orbital injury.  She did not have any imaging or hospital workup after the fall.  I am concerned that this may represent a CSF leak.  I recommended a CT scan of the maxillofacial and sinus regions, but she wanted to hold off for now.  She would rather try to treat conservatively and avoid CT if possible.  We will try Atrovent nasal spray.  If her rhinorrhea does not respond then I think imaging would be indicated.

## 2022-05-28 NOTE — Progress Notes (Signed)
Subjective:    Patient ID: Summer Davis, female    DOB: 03/23/1931, 89 y.o.   MRN: [id number]  HPI 89 year old woman, never smoker with a history of GERD, scoliosis, hypothyroidism, hyperlipidemia, mild aortic stenosis, varicose veins.  I have seen her in the past for shortness of breath that was worked up with a reassuring exercise test 2017/06/06.  Question whether there was some component of deconditioning due to scoliosis and immobility. She is referred back today for evaluation of her exertional shortness of breath. She tells me that she had a fall in 11/2021 that caused facial impact and injuries especially to nose, orbital regions. She did not go to the hospital, did see Dr Moreen Fowler. She has had clear nasal drainage ever since. She believes she is moving air adequately through her nose. She gets exertional SOB walking 1/2 a block, doing housework, often has to stop to rest. She has chronic HA, no worse after this fall.    Review of Systems As per [facility]  Past Medical History:  Diagnosis Date   Arthritis    Left hip   Cataracts, bilateral    Followed by Dr. Cordelia Pen & Dr. Manson Passey @ [facility] ophthalmology.   Chronic low back pain    DOE (dyspnea on exertion) 2013   A) 2013-nonischemic Myoview; January 2019-CPX  - Normal.  Echo 01/2017 - Normal   Esophageal reflux    GERD (gastroesophageal reflux disease)    H/O scoliosis    Hypercholesterolemia    Hypothyroidism    Insomnia    Lichen sclerosus    Memory difficulty    Mild aortic stenosis by prior echocardiogram    Echo 04/13/2020: Mild aortic valve stenosis with mean gradient of 8.3 mmHg   PONV (postoperative nausea and vomiting)    Tortuous colon    UTI (lower urinary tract infection)    Varicose veins of both lower extremities with pain    h/x varicose veins - with endoscopic vein ablation     Family History  Problem Relation Age of Onset   Heart disease Father        Later onset CAD and heart failure   Cancer Mother         Liver and pancreas   Hypertension Sister    Hyperlipidemia Brother      Social History   Socioeconomic History   Marital status: Widowed    Spouse name: Winfred   Number of children: 4   Years of education: 12   Highest education level: Not on file  Occupational History    Employer: RETIRED    Comment: [employer]  Tobacco Use   Smoking status: Never   Smokeless tobacco: Never  Substance and Sexual Activity   Alcohol use: No   Drug use: No   Sexual activity: Not Currently  Other Topics Concern   Not on file  Social History Narrative   Patient lives at home alone. She is a widow - her husband Morey Hummingbird) died in June 06, 2014   High school education.  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   Caffeine- one cup coffee daily.   - She did have prolonged secondhand smoke exposure from her first husband.   One child deceased -> he died of overdose of prescription medications   3 remaining sons aged 44, 69 and 68-> Also one stepchild.    As of 2018, she she had a total  of 7 grandchildren of her home with three-step grandchildren. 10+ great-grandchildren.   Social Determinants of Health   Financial Resource Strain: Not on file  Food Insecurity: Not on file  Transportation Needs: Not on file  Physical Activity: Not on file  Stress: Not on file  Social Connections: Not on file  Intimate Partner Violence: Not on file     Allergies  Allergen Reactions   Alendronate Sodium     Other reaction(s): diarrrhea, constipation, difficulty swallowing   Atorvastatin     Other reaction(s): myalgia   Codeine     Other reaction(s): Unknown Other reaction(s): Unknown   Denosumab     Chills and nausea  Other reaction(s): nausea and chills   Ezetimibe     Other reaction(s): fatigue   Oxycodone Nausea And Vomiting   Simvastatin     Other reaction(s): myalgia Other reaction(s): myalgia      Outpatient Medications Prior to Visit  Medication Sig Dispense Refill   Apoaequorin (PREVAGEN PO) Take by mouth daily.     Biotin 10 MG TABS Take 1 tablet daily by mouth.     Calcium Carb-Cholecalciferol 600-800 MG-UNIT TABS Take 1 tablet daily by mouth.     cholecalciferol (VITAMIN D3) 25 MCG (1000 UNIT) tablet 1 tablet     clobetasol ointment (TEMOVATE) 0.05 % SMARTSIG:Sparingly Topical PRN     diclofenac Sodium (VOLTAREN) 1 % GEL SMARTSIG:2 Gram(s) Topical 4 Times Daily PRN     donepezil (ARICEPT) 5 MG tablet Take 5 mg by mouth at bedtime.     fexofenadine (ALLEGRA) 180 MG tablet See admin instructions.     gabapentin (NEURONTIN) 100 MG capsule Take 200 mg by mouth 2 (two) times daily.  5   levothyroxine (SYNTHROID) 75 MCG tablet Take 1 tablet by mouth every other day.     levothyroxine (SYNTHROID, LEVOTHROID) 88 MCG tablet Take 88 mcg by mouth every other day. MON, WED, FRIDAY     lidocaine-prilocaine (EMLA) cream 1 gram tid prn 30 g 11   Multiple Vitamins-Minerals (PRESERVISION AREDS 2) CAPS Take 2 capsules by mouth daily.     Polyethyl Glycol-Propyl Glycol 0.4-0.3 % SOLN Place 1 drop into both eyes daily as needed (for dry eyes).     No facility-administered medications prior to visit.        Objective:   Physical Exam Vitals:   05/28/22 1511  BP: 120/70  Pulse: 88  Temp: 97.6 F (36.4 C)  TempSrc: Oral  SpO2: 99%  Weight: 113 lb 9.6 oz (51.5 kg)  Height: 5\' 1"  (1.549 m)   Gen: Pleasant, early woman, in no distress,  normal affect  ENT: No lesions,  mouth clear,  oropharynx clear, no postnasal drip  Neck: No JVD, no stridor  Lungs: No use of accessory muscles, no crackles or wheezing on normal respiration, no wheeze on forced expiration  Cardiovascular: RRR, heart sounds normal, no murmur or gallops, no peripheral edema  Musculoskeletal: No deformities, no cyanosis or clubbing  Neuro: alert, awake, non focal  Skin: Warm, no lesions or rash     Assessment  & Plan:   Rhinorrhea Clear continuous drainage happening during the day, not at night that started after she suffered a fall with facial impact and nasal and orbital injury.  She did not have any imaging or hospital workup after the fall.  I am concerned that this may represent a CSF leak.  I recommended a CT scan of the maxillofacial and sinus regions, but she wanted to  hold off for now.  She would rather try to treat conservatively and avoid CT if possible.  We will try Atrovent nasal spray.  If her rhinorrhea does not respond then I think imaging would be indicated.  Dyspnea on exertion She has had a reassuring workup in the past but her exertional tolerance, functional capacity has decreased.  I think it would be reasonable to repeat physiology.  We will arrange for pulmonary function testing to see if there is any evidence for obstructive lung disease.   Levy Pupa, MD, PhD 05/28/2022, 3:53 PM [facility] [phone] or if no answer before 7:00PM call [phone] For any issues after 7:00PM please call eLink [phone]

## 2022-05-28 NOTE — Patient Instructions (Signed)
We will arrange for pulmonary function testing Please try using ipratropium (Atrovent) nasal spray, 2 sprays each nostril 2-3 times a day to see if this helps with your nasal drainage. If your nasal drainage continues then it may be beneficial for [place] to check a maxillofacial and sinus CT scan to evaluate your sinuses, bony structures following your fall in July 2023. Follow Dr. Lamonte Sakai next available after your pulmonary function testing so we can review the results together.

## 2022-05-28 NOTE — Assessment & Plan Note (Signed)
She has had a reassuring workup in the past but her exertional tolerance, functional capacity has decreased.  I think it would be reasonable to repeat physiology.  We will arrange for pulmonary function testing to see if there is any evidence for obstructive lung disease.

## 2022-06-06 ENCOUNTER — Telehealth: Payer: Self-pay | Admitting: Emergency Medicine

## 2022-06-06 ENCOUNTER — Ambulatory Visit (INDEPENDENT_AMBULATORY_CARE_PROVIDER_SITE_OTHER): Payer: Medicare Other | Admitting: Emergency Medicine

## 2022-06-06 ENCOUNTER — Ambulatory Visit: Payer: Medicare Other | Admitting: Emergency Medicine

## 2022-06-06 DIAGNOSIS — R0609 Other forms of dyspnea: Secondary | ICD-10-CM

## 2022-06-06 DIAGNOSIS — W19XXXD Unspecified fall, subsequent encounter: Secondary | ICD-10-CM

## 2022-06-06 DIAGNOSIS — J3489 Other specified disorders of nose and nasal sinuses: Secondary | ICD-10-CM | POA: Diagnosis not present

## 2022-06-06 LAB — PULMONARY FUNCTION TEST
DL/VA % pred: 130 %
DL/VA: 5.39 ml/min/mmHg/L
DLCO cor % pred: 83 %
DLCO cor: 13.74 ml/min/mmHg
DLCO unc % pred: 83 %
DLCO unc: 13.74 ml/min/mmHg
FEF 25-75 Post: 0.93 L/sec
FEF 25-75 Pre: 0.67 L/sec
FEF2575-%Change-Post: 39 %
FEF2575-%Pred-Post: 121 %
FEF2575-%Pred-Pre: 87 %
FEV1-%Change-Post: 12 %
FEV1-%Pred-Post: 93 %
FEV1-%Pred-Pre: 82 %
FEV1-Post: 1.24 L
FEV1-Pre: 1.11 L
FEV1FVC-%Change-Post: 3 %
FEV1FVC-%Pred-Pre: 91 %
FEV6-%Change-Post: 8 %
FEV6-%Pred-Post: 108 %
FEV6-%Pred-Pre: 99 %
FEV6-Post: 1.84 L
FEV6-Pre: 1.69 L
FEV6FVC-%Pred-Post: 108 %
FEV6FVC-%Pred-Pre: 108 %
FVC-%Change-Post: 8 %
FVC-%Pred-Post: 100 %
FVC-%Pred-Pre: 91 %
FVC-Post: 1.84 L
FVC-Pre: 1.69 L
Post FEV1/FVC ratio: 68 %
Post FEV6/FVC ratio: 100 %
Pre FEV1/FVC ratio: 65 %
Pre FEV6/FVC Ratio: 100 %
RV % pred: 121 %
RV: 2.94 L
TLC % pred: 101 %
TLC: 4.67 L

## 2022-06-06 NOTE — Telephone Encounter (Signed)
Please let the patient know that I have reviewed her pulmonary function testing.  These are consistent with some mild abnormal airflow in an asthma pattern.  If she is willing to do so I think will be reasonable for her to consider trying albuterol as needed to see if she gets any benefit.

## 2022-06-06 NOTE — Progress Notes (Signed)
Full PFT performed today.

## 2022-06-06 NOTE — Patient Instructions (Signed)
Full PFT performed today.

## 2022-06-07 MED ORDER — ALBUTEROL SULFATE HFA 108 (90 BASE) MCG/ACT IN AERS: 2.0000 | INHALATION_SPRAY | Freq: Four times a day (QID) | RESPIRATORY_TRACT | 6 refills | Status: AC | PRN

## 2022-06-07 NOTE — Telephone Encounter (Signed)
Spoke with pt and reviewed PFT results along with Albuterol instructions. Pt verified pharmacy and stated understanding. Albuterol HFA order was placed.  Pt is still concerned about her runny nose. She states nasal spray does help for a short while then her nose runs again. Dr. Lamonte Sakai please advise

## 2022-06-07 NOTE — Telephone Encounter (Signed)
I think she needs a CT of the skull base and temporal bones to insure no injury or possible CSF leak.

## 2022-06-11 NOTE — Telephone Encounter (Signed)
Spoke with pt and informed her CT would be ordered and PCCs would call her to schedule. Pt stated understanding and order was placed. Nothing further needed at this time.

## 2022-07-03 ENCOUNTER — Ambulatory Visit (INDEPENDENT_AMBULATORY_CARE_PROVIDER_SITE_OTHER): Payer: Medicare Other | Admitting: Podiatry

## 2022-07-03 DIAGNOSIS — M21611 Bunion of right foot: Secondary | ICD-10-CM

## 2022-07-03 DIAGNOSIS — M2011 Hallux valgus (acquired), right foot: Secondary | ICD-10-CM

## 2022-07-03 DIAGNOSIS — M2041 Other hammer toe(s) (acquired), right foot: Secondary | ICD-10-CM | POA: Diagnosis not present

## 2022-07-03 NOTE — Progress Notes (Signed)
  Subjective:  Patient ID: Runell Gess, female    DOB: 1931-01-28,  MRN: [id number]  Chief Complaint  Patient presents with   Hammer Toe    right foot 2nd toe pain/ discuss possible surgery    89 y.o. female presents with the above complaint. History confirmed with patient.  Second toe still very painful where it is getting rubbed on by the big toe  Objective:  Physical Exam: warm, good capillary refill, no trophic changes or ulcerative lesions, normal DP and PT pulses, normal sensory exam, and hallux valgus deformity on the right foot with under lapping hallux over the second toe, mild hyperkeratosis medial second toe pain and erythema here.   Radiographs: Multiple views x-ray of right foot : Moderate osteoporosis, degenerative changes in the first MTPJ, she has moderate severe hallux valgus deformity with underlapping of the hallux under the second toe Assessment:   1. Hammertoe of right foot   2. Hallux valgus with bunions, right       Plan:  Patient was evaluated and treated and all questions answered.  We again discussed surgical treatment.  The toe spacer is no longer helpful.  The bunion is hurting as well.  I discussed with her that significant bunionectomy with osteotomy or fusion like would be difficult at her age but to address her current issue a modified McBride bunionectomy as well as a partial second toe amputation likely would alleviate her symptoms.  We discussed the risk benefits and potential complications of this including but not limited to  pain, swelling, infection, scar, numbness which may be temporary or permanent, chronic pain, stiffness, nerve pain or damage, wound healing problems and recurrence.  We also discussed likelihood of the hallux drifting over further after amputation of the toe.  She understands wishes to proceed.  Informed sent signed and reviewed.  All questions addressed    Surgical plan:  Procedure: -Right foot partial second toe  amputation and modified McBride bunionectomy  Location: -[facility]  Anesthesia plan: -IV sedation with local  Postoperative pain plan: - Tylenol 1000 mg every 6 hours,  gabapentin 300 mg every 8 hours x5 days, oxycodone 5 mg 1-2 tabs every 6 hours only as needed  DVT prophylaxis: -None required  WB Restrictions / DME needs: -WBAT in surgical shoe  No follow-ups on file.

## 2022-07-09 ENCOUNTER — Ambulatory Visit
Admission: RE | Admit: 2022-07-09 | Discharge: 2022-07-09 | Disposition: A | Discharge status: Home / Self Care | Payer: Medicare Other | Source: Ambulatory Visit | Attending: Emergency Medicine | Admitting: Emergency Medicine

## 2022-07-09 DIAGNOSIS — W19XXXD Unspecified fall, subsequent encounter: Secondary | ICD-10-CM

## 2022-07-15 ENCOUNTER — Ambulatory Visit: Payer: Medicare Other | Admitting: Podiatry

## 2022-07-19 ENCOUNTER — Emergency Department (HOSPITAL_COMMUNITY)
Admission: EM | Admit: 2022-07-19 | Discharge: 2022-07-19 | Disposition: A | Discharge status: Home / Self Care | Payer: Medicare Other | Attending: Emergency Medicine | Admitting: Emergency Medicine

## 2022-07-19 ENCOUNTER — Other Ambulatory Visit: Payer: Self-pay

## 2022-07-19 ENCOUNTER — Emergency Department (HOSPITAL_COMMUNITY): Payer: Medicare Other

## 2022-07-19 ENCOUNTER — Encounter (HOSPITAL_COMMUNITY): Payer: Self-pay | Admitting: *Deleted

## 2022-07-19 DIAGNOSIS — S99191A Other physeal fracture of right metatarsal, initial encounter for closed fracture: Secondary | ICD-10-CM

## 2022-07-19 DIAGNOSIS — W06XXXA Fall from bed, initial encounter: Secondary | ICD-10-CM | POA: Insufficient documentation

## 2022-07-19 DIAGNOSIS — S92354A Nondisplaced fracture of fifth metatarsal bone, right foot, initial encounter for closed fracture: Secondary | ICD-10-CM | POA: Insufficient documentation

## 2022-07-19 DIAGNOSIS — S99921A Unspecified injury of right foot, initial encounter: Secondary | ICD-10-CM | POA: Diagnosis present

## 2022-07-19 NOTE — ED Triage Notes (Signed)
Here by POV for R foot pain, injured last Saturday, was unable to address injury/pain d/t needing to attend her brother's funeral. Describes as foot was asleep, I got up, couldn't feel it, stumbled and hurt my mid foot. Denies other injuries. Hasn't gotten better. Maybe a little worse.

## 2022-07-19 NOTE — Discharge Instructions (Signed)
You have a fracture at the base of your fifth toe/tiny toe.  You need to be nonweightbearing until cleared by orthopedics, call the number listed here to schedule an appointment in the next week for evaluation to make sure the fracture is recovering appropriately.  Use crutches to move around, you can take Tylenol and Motrin as needed for pain and inflammation.  Return to the ED for new or concerning symptoms.

## 2022-07-19 NOTE — ED Provider Notes (Signed)
[place] EMERGENCY DEPARTMENT AT [facility] Provider Note   CSN: [id number] Arrival date & time: 07/19/22  1135     History  Chief Complaint  Patient presents with   Foot Pain    Kadance JANELLEN DOAKES is a 89 y.o. female.   Foot Pain     Patient presents to the emergency department due to right foot pain.  Started 5 days ago after landing on her foot incorrectly getting out of bed, she has been ambulating on it since with significant pain over the fifth toe.  She does not use a cane or walker at baseline, denies pain radiating elsewhere.  Not on blood thinners.  Home Medications Prior to Admission medications   Medication Sig Start Date End Date Taking? Authorizing Provider  albuterol (VENTOLIN HFA) 108 (90 Base) MCG/ACT inhaler Inhale 2 puffs into the lungs every 6 (six) hours as needed for wheezing or shortness of breath. 06/07/22   Byrum, Rose Fillers, MD  Apoaequorin (PREVAGEN PO) Take by mouth daily.    [provider]  Biotin 10 MG TABS Take 1 tablet daily by mouth.    [provider]  Calcium Carb-Cholecalciferol 600-800 MG-UNIT TABS Take 1 tablet daily by mouth.    [provider]  cholecalciferol (VITAMIN D3) 25 MCG (1000 UNIT) tablet 1 tablet    [provider]  clobetasol ointment (TEMOVATE) 0.05 % SMARTSIG:Sparingly Topical PRN 10/22/19   [provider]  diclofenac Sodium (VOLTAREN) 1 % GEL SMARTSIG:2 Gram(s) Topical 4 Times Daily PRN 09/27/21   [provider]  donepezil (ARICEPT) 5 MG tablet Take 5 mg by mouth at bedtime. 04/24/22   [provider]  fexofenadine (ALLEGRA) 180 MG tablet See admin instructions.    [provider]  gabapentin (NEURONTIN) 100 MG capsule Take 200 mg by mouth 2 (two) times daily. 04/24/15   [provider]  ipratropium (ATROVENT) 0.03 % nasal spray Place 2 sprays into both nostrils every 12 (twelve) hours. 05/28/22   Collene Gobble, MD  levothyroxine  (SYNTHROID) 75 MCG tablet Take 1 tablet by mouth every other day.    [provider]  levothyroxine (SYNTHROID, LEVOTHROID) 88 MCG tablet Take 88 mcg by mouth every other day. MON, WED, FRIDAY    [provider]  lidocaine-prilocaine (EMLA) cream 1 gram tid prn 09/27/21   Marcial Pacas, MD  Multiple Vitamins-Minerals (PRESERVISION AREDS 2) CAPS Take 2 capsules by mouth daily.    [provider]  Polyethyl Glycol-Propyl Glycol 0.4-0.3 % SOLN Place 1 drop into both eyes daily as needed (for dry eyes).    [provider]      Allergies    Alendronate sodium, Atorvastatin, Codeine, Denosumab, Ezetimibe, Oxycodone, and Simvastatin    Review of Systems   Review of Systems  Physical Exam Updated Vital Signs BP (!) 189/75 (BP Location: Left Arm)   Pulse 86   Temp 97.8 F (36.6 C) (Oral)   Resp 16   Ht '5\' 3"'$  (1.6 m)   Wt 47.6 kg   SpO2 100%   BMI 18.60 kg/m  Physical Exam Vitals and nursing note reviewed. Exam conducted with a chaperone present.  Constitutional:      General: She is not in acute distress.    Appearance: Normal appearance.  HENT:     Head: Normocephalic and atraumatic.  Eyes:     General: No scleral icterus.    Extraocular Movements: Extraocular movements intact.     Pupils: Pupils are equal,  round, and reactive to light.  Cardiovascular:     Pulses: Normal pulses.  Musculoskeletal:        General: Tenderness present.     Comments: Able to move each individual hematoma on right foot, able to plantarflex and dorsiflex without difficulty, ankle inversion eversion also intact.  Tenderness over fifth metatarsal base with contusion to fourth toe  Skin:    Capillary Refill: Capillary refill takes less than 2 seconds.     Coloration: Skin is not jaundiced.     Findings: Bruising present.  Neurological:     Mental Status: She is alert. Mental status is at baseline.     Coordination: Coordination normal.     ED Results / Procedures /  Treatments   Labs (all labs ordered are listed, but only abnormal results are displayed) Labs Reviewed - No data to display  EKG None  Radiology DG Ankle Complete Right  Result Date: 07/19/2022 CLINICAL DATA:  Injury and pain. Injury a week ago. Changing symptoms EXAM: RIGHT ANKLE - COMPLETE 3 VIEW; RIGHT FOOT COMPLETE - 3 VIEW COMPARISON:  Foot x-ray 01/07/2022.  Images only.  No report FINDINGS: Comminuted mildly displaced fracture of the base of the fifth metatarsal. Osteopenia. Mild degenerative changes along the interphalangeal joints. Small well corticated plantar calcaneal spur. Hallux valgus deformity of the first ray with hypertrophic degenerative changes. In addition of the ankle there is no additional fracture or dislocation. Osteopenia. Soft tissue swelling. Preserved joint spaces. IMPRESSION: Comminuted mildly displaced fracture of the base of fifth metatarsal. Soft tissue swelling. Osteopenia with degenerative changes. Hallux valgus deformity of the first ray Electronically Signed   By: Jill Side M.D.   On: 07/19/2022 12:18   DG Foot Complete Right  Result Date: 07/19/2022 CLINICAL DATA:  Injury and pain. Injury a week ago. Changing symptoms EXAM: RIGHT ANKLE - COMPLETE 3 VIEW; RIGHT FOOT COMPLETE - 3 VIEW COMPARISON:  Foot x-ray 01/07/2022.  Images only.  No report FINDINGS: Comminuted mildly displaced fracture of the base of the fifth metatarsal. Osteopenia. Mild degenerative changes along the interphalangeal joints. Small well corticated plantar calcaneal spur. Hallux valgus deformity of the first ray with hypertrophic degenerative changes. In addition of the ankle there is no additional fracture or dislocation. Osteopenia. Soft tissue swelling. Preserved joint spaces. IMPRESSION: Comminuted mildly displaced fracture of the base of fifth metatarsal. Soft tissue swelling. Osteopenia with degenerative changes. Hallux valgus deformity of the first ray Electronically Signed   By: Jill Side M.D.   On: 07/19/2022 12:18    Procedures Procedures    Medications Ordered in ED Medications - No data to display  ED Course/ Medical Decision Making/ A&P                             Medical Decision Making Amount and/or Complexity of Data Reviewed Radiology: ordered.   This is an 89 year old female presenting to the emergency department due to right toe pain.  On exam she is neurovascular tact brisk cap refill, DP and PT are 2+.  Able to move EXTR without difficulty, no crepitus.  X-ray ordered to assess for possible metatarsal fracture, per radiologist interpretation she does have a fracture of the base of the fifth metatarsal.  I agree with this interpretation.  Concern for possible Jones fracture, I consulted orthopedics and spoke with PA Orion Crook who agrees this is a Jones fracture.  Will have patient in posterior slab, nonweightbearing and  close follow-up with orthopedics.  Plan was discussed with the patient who is comfortable ambulating with crutches.        Final Clinical Impression(s) / ED Diagnoses Final diagnoses:  Closed fracture of base of fifth metatarsal bone of right foot at metaphyseal-diaphyseal junction, initial encounter    Rx / DC Orders ED Discharge Orders     None         Sherrill Raring, PA-C 07/19/22 1317    Gareth Morgan, MD 07/19/22 2333

## 2022-07-26 ENCOUNTER — Other Ambulatory Visit (INDEPENDENT_AMBULATORY_CARE_PROVIDER_SITE_OTHER): Payer: Medicare Other

## 2022-07-26 ENCOUNTER — Encounter: Payer: Self-pay | Admitting: Physician Assistant

## 2022-07-26 ENCOUNTER — Ambulatory Visit (INDEPENDENT_AMBULATORY_CARE_PROVIDER_SITE_OTHER): Payer: Medicare Other | Admitting: Physician Assistant

## 2022-07-26 DIAGNOSIS — M79671 Pain in right foot: Secondary | ICD-10-CM

## 2022-07-26 DIAGNOSIS — S92351A Displaced fracture of fifth metatarsal bone, right foot, initial encounter for closed fracture: Secondary | ICD-10-CM

## 2022-07-26 NOTE — Progress Notes (Signed)
Office Visit Note   Patient: Summer Davis           Date of Birth: [birth year]           MRN: [id number] Visit Date: 07/26/2022              Requested by: Antony Contras, MD [place],  [place] [address] PCP: Antony Contras, MD   Assessment & Plan: Visit Diagnoses:  1. Pain in right foot   2. Closed fracture of base of fifth metatarsal bone of right foot     Plan: Pleasant 89 year old woman who is accompanied by her son.  She is 1 day status post arising from the chair and rolling onto her right foot.  She was seen in the emergency room and diagnosed with a comminuted base of fifth metatarsal fracture she has been in a splint.  She does have a little strength in her left side secondary to hip issues.  She has been walking on the splint that was provided to her.  I discussed her case with Dr. Rolena Infante.  She can weight-bear in a short cam boot.  Should use the walker of course for balance.  Will follow-up in 3weeks.  She has been followed by a podiatrist and was due to undergo correction surgery on this foot for her hallux valgus deformity.  She is going to contact their office I am happy to follow-up on her but if it is more convenient for her to follow with her podiatrist I think that is fine to  Follow-Up Instructions: Return in about 2 weeks (around 08/09/2022).   Orders:  Orders Placed This Encounter  Procedures   XR Foot Complete Right   No orders of the defined types were placed in this encounter.     Procedures: No procedures performed   Clinical Data: No additional findings.   Subjective: Chief Complaint  Patient presents with   Right Foot - Pain    HPI The patient is a pleasant 89 year old woman who is 1 week status post rolling her foot when she was arising out of a chair.  She has a history of osteoporosis and weakness in the left leg  Review of Systems  All other systems reviewed and are negative.    Objective: Vital Signs:  There were no vitals taken for this visit.  Physical Exam Constitutional:      Appearance: Normal appearance.  Pulmonary:     Effort: Pulmonary effort is normal.  Neurological:     General: No focal deficit present.     Mental Status: She is alert.  Psychiatric:        Mood and Affect: Mood normal.     Ortho Exam Right foot she has mild soft tissue swelling she has hallux valgus deformity.  She is neurovascularly intact.  Tender over the base of the fifth metatarsal but her foot is warm with brisk capillary refill Specialty Comments:  No specialty comments available.  Imaging: No results found.   PMFS History: Patient Active Problem List   Diagnosis Date Noted   Closed fracture of base of fifth metatarsal bone of right foot 07/26/2022   Rhinorrhea 05/28/2022   Contusion of right orbit 11/22/2021   Gait abnormality 09/27/2021   Restless leg syndrome 09/27/2021   Post herpetic neuralgia 09/27/2021   Other symptoms and signs concerning food and fluid intake 09/27/2021   Acute cystitis 07/13/2021   Dysuria 07/13/2021   Elevated blood-pressure reading  without diagnosis of hypertension 07/13/2021   Allergic rhinitis 03/28/2021   Atrophic vaginitis 03/28/2021   Grief 03/28/2021   Hardening of the aorta (main artery of the heart) ([place]) 03/28/2021   Hypothyroidism 03/28/2021   Insomnia 0000000   Lichen sclerosus 0000000   Low back pain 03/28/2021   Migraine 03/28/2021   Mild aortic valve regurgitation 03/28/2021   Osteoporosis 03/28/2021   Other elevated white blood cell count 03/28/2021   Postherpetic neuralgia 03/28/2021   Pulmonary emphysema ([place]) 03/28/2021   Varicose veins of other specified sites 03/28/2021   Mild aortic stenosis by prior echocardiography 06/02/2020   Bilateral leg pain 05/24/2020   Hyperlipidemia LDL goal <100 03/20/2020   Dry eyes 02/11/2019   Abnormal chest x-ray 07/17/2018   Chronic hoarseness 04/07/2018   Gastroesophageal reflux  disease 04/07/2018   Oropharyngeal dysphagia 04/07/2018   Upper airway cough syndrome 03/28/2017   Dyspnea on exertion 10/31/2016   Exercise intolerance 10/31/2016   Essential hypertension 10/31/2016   Memory loss 06/06/2015   Posterior vitreous detachment of left eye 02/02/2015   Exudative age-related macular degeneration ([place]) 08/19/2013   Pseudophakia 08/04/2013   Paresthesia 01/13/2013   Expected blood loss anemia 11/19/2012   S/P right TKA 11/17/2012   Chest pain with low risk for cardiac etiology 02/28/2012   Nuclear cataract 01/01/2012   Arthritis, hip 10/04/2011   Scoliosis 10/04/2011   Past Medical History:  Diagnosis Date   Arthritis    Left hip   Cataracts, bilateral    Followed by Dr. Cordelia Pen & Dr. Manson Passey @ [facility].   Chronic low back pain    DOE (dyspnea on exertion) 2013   A) 2013-nonischemic Myoview; January 2019-CPX  - Normal.  Echo 01/2017 - Normal   Esophageal reflux    GERD (gastroesophageal reflux disease)    H/O scoliosis    Hypercholesterolemia    Hypothyroidism    Insomnia    Lichen sclerosus    Memory difficulty    Mild aortic stenosis by prior echocardiogram    Echo 04/13/2020: Mild aortic valve stenosis with mean gradient of 8.3 mmHg   PONV (postoperative nausea and vomiting)    Tortuous colon    UTI (lower urinary tract infection)    Varicose veins of both lower extremities with pain    h/x varicose veins - with endoscopic vein ablation    Family History  Problem Relation Age of Onset   Heart disease Father        Later onset CAD and heart failure   Cancer Mother        Liver and pancreas   Hypertension Sister    Hyperlipidemia Brother     Past Surgical History:  Procedure Laterality Date   CARDIOPULMONARY EXERCISE TEST  05/2017   : Relatively unremarkable.  Excellent exercise tolerance.   CATARACT EXTRACTION Bilateral 2016   Left-sided surgery was unsuccessful.   CHOLECYSTECTOMY  1976   CORONARY CALCIUM SCORE  04/17/2020    Calcium score 528, moderate stenosis of CAD-RADS 3.  CT FFR evaluation of Left Main disease was 0.97.  LAD was 0.90, LCx 0.96 and RCA 0.92.  (All nonsignificant nonobstructive).  Mild calcified plaque in the proximal LAD 25 and 50%.  Aortic atherosclerosis noted.   CORONARY CT ANGIOGRAM  11/20/2016   Coronary Calcium Score 367 (HIGH). <30% calcified proximal LAD, otherwise no significant disease.  Calcification of the Aortic Valve base near Left Main.  No significant noncardiac findings   ENDOSCOPIC VEIN LASER TREATMENT  EYE MUSCLE SURGERY Left    FOOT NEUROMA SURGERY Right    NM MYOVIEW LTD  02/2012   No evidence of ischemia or infarction. Normal ejection fraction.   TONSILLECTOMY AND ADENOIDECTOMY  1938   TOTAL HIP ARTHROPLASTY Left 2000   Left   TOTAL KNEE ARTHROPLASTY Right 11/17/2012   Procedure: RIGHT TOTAL KNEE ARTHROPLASTY;  Surgeon: Mauri Pole, MD;  Location: [facility];  Service: Orthopedics;  Laterality: Right;   TRANSTHORACIC ECHOCARDIOGRAM  01/14/2017    EF 55 to 60%.  GR 1 DD.  Aortic valve sclerosis with mild AI.  Mild TR.   TRANSTHORACIC ECHOCARDIOGRAM  04/13/2020   EF 60 to 70%.  Mild LVH with GR 1 DD.  Trivial small pericardial effusion, mild aortic valve stenosis with mean gradient of 8.3 mmHg   Social History   Occupational History    Employer: RETIRED    Comment: [employer] schools  Tobacco Use   Smoking status: Never   Smokeless tobacco: Never  Substance and Sexual Activity   Alcohol use: No   Drug use: No   Sexual activity: Not Currently

## 2022-08-01 ENCOUNTER — Telehealth: Payer: Self-pay

## 2022-08-01 NOTE — Telephone Encounter (Signed)
Caren Griffins from [facility] called and stated that Summer Davis needed to cancel her surgery with Dr. Sherryle Lis on 08/16/2022. I called and spoke to [facility], she stated she fell on 07/19/2022 and broke her right foot. She will call back if she decides to reschedule. Notified Dr. Sherryle Lis

## 2022-08-15 ENCOUNTER — Ambulatory Visit (INDEPENDENT_AMBULATORY_CARE_PROVIDER_SITE_OTHER): Payer: Medicare Other | Admitting: Physician Assistant

## 2022-08-15 ENCOUNTER — Encounter: Payer: Self-pay | Admitting: Physician Assistant

## 2022-08-15 ENCOUNTER — Other Ambulatory Visit (INDEPENDENT_AMBULATORY_CARE_PROVIDER_SITE_OTHER): Payer: Medicare Other

## 2022-08-15 DIAGNOSIS — S92351A Displaced fracture of fifth metatarsal bone, right foot, initial encounter for closed fracture: Secondary | ICD-10-CM

## 2022-08-15 NOTE — Progress Notes (Signed)
Office Visit Note   Patient: Summer Davis           Date of Birth: Jul 03, 1930           MRN: [id number] Visit Date: 08/15/2022              Requested by: Antony Contras, MD [place],  [place] [address] PCP: Antony Contras, MD  Chief Complaint  Patient presents with   Right Foot - Follow-up      HPI: [name] is accompanied by her son today.  She is 3 weeks status post fracture to the base of the fifth metatarsal of her right foot.  She has been in a short cam walker boot and avoiding placing much weight on her foot.  She does think it feels a little bit better.  Assessment & Plan: Visit Diagnoses:  1. Closed fracture of base of fifth metatarsal bone of right foot     Plan: X-rays today demonstrate stable fracture.  She may put weight on her foot in the boot.  Will follow-up in 3 weeks.  X-rays at that time.  Follow-Up Instructions: No follow-ups on file.   Ortho Exam  Patient is alert, oriented, no adenopathy, well-dressed, normal affect, normal respiratory effort. Examination strong dorsalis pedis pulse toes are warm no swelling no erythema no signs of infection.  She does still have some tenderness to deep palpation of the base of the fifth metatarsal sensation is intact no other tenderness in her foot  Imaging: XR Foot Complete Right  Result Date: 08/15/2022 Radiographs of the right foot were obtained today.  She has a fragmented fracture of the base of the fifth metatarsal remains in good position.  No images are attached to the encounter.  Labs: Lab Results  Component Value Date   ESRSEDRATE 5 06/15/2013   ESRSEDRATE 9 01/13/2013   CRP 0.3 06/15/2013   CRP 0.9 01/13/2013     Lab Results  Component Value Date   ALBUMIN 4.6 09/27/2021   ALBUMIN 4.2 12/08/2020    No results found for: "MG" No results found for: "VD25OH"  No results found for: "PREALBUMIN"    Latest Ref Rng & Units 09/27/2021    9:30 AM 06/09/2017    2:25  PM 02/28/2017   11:18 AM  CBC EXTENDED  WBC 3.4 - 10.8 x10E3/uL 6.1  7.0  6.1   RBC 3.77 - 5.28 x10E6/uL 5.18  4.68  5.13   Hemoglobin 11.1 - 15.9 g/dL 14.9  13.4  14.7   HCT 34.0 - 46.6 % 44.3  39.7  44.2   Platelets 150 - 450 x10E3/uL 151  263.0  274.0   NEUT# 1.4 - 7.0 x10E3/uL 4.3  4.7  4.0   Lymph# 0.7 - 3.1 x10E3/uL 1.2  1.3  1.4      There is no height or weight on file to calculate BMI.  Orders:  Orders Placed This Encounter  Procedures   XR Foot Complete Right   No orders of the defined types were placed in this encounter.    Procedures: No procedures performed  Clinical Data: No additional findings.  ROS:  All other systems negative, except as noted in the HPI. Review of Systems  Objective: Vital Signs: There were no vitals taken for this visit.  Specialty Comments:  No specialty comments available.  PMFS History: Patient Active Problem List   Diagnosis Date Noted   Closed fracture of base of fifth metatarsal bone of  right foot 07/26/2022   Rhinorrhea 05/28/2022   Contusion of right orbit 11/22/2021   Gait abnormality 09/27/2021   Restless leg syndrome 09/27/2021   Post herpetic neuralgia 09/27/2021   Other symptoms and signs concerning food and fluid intake 09/27/2021   Acute cystitis 07/13/2021   Dysuria 07/13/2021   Elevated blood-pressure reading without diagnosis of hypertension 07/13/2021   Allergic rhinitis 03/28/2021   Atrophic vaginitis 03/28/2021   Grief 03/28/2021   Hardening of the aorta (main artery of the heart) 03/28/2021   Hypothyroidism 03/28/2021   Insomnia 0000000   Lichen sclerosus 0000000   Low back pain 03/28/2021   Migraine 03/28/2021   Mild aortic valve regurgitation 03/28/2021   Osteoporosis 03/28/2021   Other elevated white blood cell count 03/28/2021   Postherpetic neuralgia 03/28/2021   Pulmonary emphysema 03/28/2021   Varicose veins of other specified sites 03/28/2021   Mild aortic stenosis by prior  echocardiography 06/02/2020   Bilateral leg pain 05/24/2020   Hyperlipidemia LDL goal <100 03/20/2020   Dry eyes 02/11/2019   Abnormal chest x-ray 07/17/2018   Chronic hoarseness 04/07/2018   Gastroesophageal reflux disease 04/07/2018   Oropharyngeal dysphagia 04/07/2018   Upper airway cough syndrome 03/28/2017   Dyspnea on exertion 10/31/2016   Exercise intolerance 10/31/2016   Essential hypertension 10/31/2016   Memory loss 06/06/2015   Posterior vitreous detachment of left eye 02/02/2015   Exudative age-related macular degeneration 08/19/2013   Pseudophakia 08/04/2013   Paresthesia 01/13/2013   Expected blood loss anemia 11/19/2012   S/P right TKA 11/17/2012   Chest pain with low risk for cardiac etiology 02/28/2012   Nuclear cataract 01/01/2012   Arthritis, hip 10/04/2011   Scoliosis 10/04/2011   Past Medical History:  Diagnosis Date   Arthritis    Left hip   Cataracts, bilateral    Followed by Dr. Cordelia Pen & Dr. Manson Passey @ [facility].   Chronic low back pain    DOE (dyspnea on exertion) 2013   A) 2013-nonischemic Myoview; January 2019-CPX  - Normal.  Echo 01/2017 - Normal   Esophageal reflux    GERD (gastroesophageal reflux disease)    H/O scoliosis    Hypercholesterolemia    Hypothyroidism    Insomnia    Lichen sclerosus    Memory difficulty    Mild aortic stenosis by prior echocardiogram    Echo 04/13/2020: Mild aortic valve stenosis with mean gradient of 8.3 mmHg   PONV (postoperative nausea and vomiting)    Tortuous colon    UTI (lower urinary tract infection)    Varicose veins of both lower extremities with pain    h/x varicose veins - with endoscopic vein ablation    Family History  Problem Relation Age of Onset   Heart disease Father        Later onset CAD and heart failure   Cancer Mother        Liver and pancreas   Hypertension Sister    Hyperlipidemia Brother     Past Surgical History:  Procedure Laterality Date   CARDIOPULMONARY EXERCISE  TEST  05/2017   : Relatively unremarkable.  Excellent exercise tolerance.   CATARACT EXTRACTION Bilateral 2016   Left-sided surgery was unsuccessful.   CHOLECYSTECTOMY  1976   CORONARY CALCIUM SCORE  04/17/2020   Calcium score 528, moderate stenosis of CAD-RADS 3.  CT FFR evaluation of Left Main disease was 0.97.  LAD was 0.90, LCx 0.96 and RCA 0.92.  (All nonsignificant nonobstructive).  Mild calcified plaque in the proximal  LAD 25 and 50%.  Aortic atherosclerosis noted.   CORONARY CT ANGIOGRAM  11/20/2016   Coronary Calcium Score 367 (HIGH). <30% calcified proximal LAD, otherwise no significant disease.  Calcification of the Aortic Valve base near Left Main.  No significant noncardiac findings   ENDOSCOPIC VEIN LASER TREATMENT     EYE MUSCLE SURGERY Left    FOOT NEUROMA SURGERY Right    NM MYOVIEW LTD  02/2012   No evidence of ischemia or infarction. Normal ejection fraction.   TONSILLECTOMY AND ADENOIDECTOMY  1938   TOTAL HIP ARTHROPLASTY Left 2000   Left   TOTAL KNEE ARTHROPLASTY Right 11/17/2012   Procedure: RIGHT TOTAL KNEE ARTHROPLASTY;  Surgeon: Mauri Pole, MD;  Location: [facility];  Service: Orthopedics;  Laterality: Right;   TRANSTHORACIC ECHOCARDIOGRAM  01/14/2017    EF 55 to 60%.  GR 1 DD.  Aortic valve sclerosis with mild AI.  Mild TR.   TRANSTHORACIC ECHOCARDIOGRAM  04/13/2020   EF 60 to 70%.  Mild LVH with GR 1 DD.  Trivial small pericardial effusion, mild aortic valve stenosis with mean gradient of 8.3 mmHg   Social History   Occupational History    Employer: RETIRED    Comment: [employer] schools  Tobacco Use   Smoking status: Never   Smokeless tobacco: Never  Substance and Sexual Activity   Alcohol use: No   Drug use: No   Sexual activity: Not Currently

## 2022-08-22 ENCOUNTER — Encounter: Payer: Medicare Other | Admitting: Podiatry

## 2022-09-04 ENCOUNTER — Encounter: Payer: Medicare Other | Admitting: Podiatry

## 2022-09-05 ENCOUNTER — Encounter: Payer: Self-pay | Admitting: Physician Assistant

## 2022-09-05 ENCOUNTER — Ambulatory Visit (INDEPENDENT_AMBULATORY_CARE_PROVIDER_SITE_OTHER): Payer: Medicare Other | Admitting: Physician Assistant

## 2022-09-05 ENCOUNTER — Other Ambulatory Visit (INDEPENDENT_AMBULATORY_CARE_PROVIDER_SITE_OTHER): Payer: Medicare Other

## 2022-09-05 DIAGNOSIS — M79671 Pain in right foot: Secondary | ICD-10-CM | POA: Diagnosis not present

## 2022-09-05 NOTE — Progress Notes (Signed)
Office Visit Note   Patient: Summer Davis           Date of Birth: 12/25/1930           MRN: [id number] Visit Date: 09/05/2022              Requested by: Tally Joe, MD [phone] [address] [place],  [place] [address] PCP: Tally Joe, MD  Chief Complaint  Patient presents with   Right Foot - Follow-up    5th metatarsal fracture      HPI: Patient is a pleasant 89 year old woman who is accompanied by her son today.  She is approximately 6 to 7 weeks status post right fifth metatarsal base fracture.  At her last visit I placed her in a short cam  Assessment & Plan: Visit Diagnoses:  1. Pain in right foot     Plan: I instructed the patient to start weaning out of her boot and she can wean off the walker as tolerated.  I would not suggest doing both of those things at the same time.  She should use a supportive shoe.  Would like to see her 1 more time in a month.  I advised her and her son to let pain be her guide when returning to full weightbearing without the walker.  I offered her physical therapy to help her do this but she has declined it at this time  Ortho Exam  Patient is alert, oriented, no adenopathy, well-dressed, normal affect, normal respiratory effort. Examination of her foot she is neurovascularly intact she has no swelling or erythema she still mildly tender to palpation deeply over the base of the fifth metatarsal she has good dorsiflexion plantarflexion somewhat weak on eversion.  Imaging: No results found. No images are attached to the encounter.  Labs: Lab Results  Component Value Date   ESRSEDRATE 5 06/15/2013   ESRSEDRATE 9 01/13/2013   CRP 0.3 06/15/2013   CRP 0.9 01/13/2013     Lab Results  Component Value Date   ALBUMIN 4.6 09/27/2021   ALBUMIN 4.2 12/08/2020    No results found for: "MG" No results found for: "VD25OH"  No results found for: "PREALBUMIN"    Latest Ref Rng & Units 09/27/2021    9:30 AM 06/09/2017    2:25  PM 02/28/2017   11:18 AM  CBC EXTENDED  WBC 3.4 - 10.8 x10E3/uL 6.1  7.0  6.1   RBC 3.77 - 5.28 x10E6/uL 5.18  4.68  5.13   Hemoglobin 11.1 - 15.9 g/dL 08.6  57.8  46.9   HCT 34.0 - 46.6 % 44.3  39.7  44.2   Platelets 150 - 450 x10E3/uL 151  263.0  274.0   NEUT# 1.4 - 7.0 x10E3/uL 4.3  4.7  4.0   Lymph# 0.7 - 3.1 x10E3/uL 1.2  1.3  1.4      There is no height or weight on file to calculate BMI.  Orders:  Orders Placed This Encounter  Procedures   XR Foot Complete Right   No orders of the defined types were placed in this encounter.    Procedures: No procedures performed  Clinical Data: No additional findings.  ROS:  All other systems negative, except as noted in the HPI. Review of Systems  All other systems reviewed and are negative.  Objective: Vital Signs: There were no vitals taken for this visit.  Specialty Comments:  No specialty comments available.  PMFS History: Patient Active Problem List  Diagnosis Date Noted   Closed fracture of base of fifth metatarsal bone of right foot 07/26/2022   Rhinorrhea 05/28/2022   Contusion of right orbit 11/22/2021   Gait abnormality 09/27/2021   Restless leg syndrome 09/27/2021   Post herpetic neuralgia 09/27/2021   Other symptoms and signs concerning food and fluid intake 09/27/2021   Acute cystitis 07/13/2021   Dysuria 07/13/2021   Elevated blood-pressure reading without diagnosis of hypertension 07/13/2021   Allergic rhinitis 03/28/2021   Atrophic vaginitis 03/28/2021   Grief 03/28/2021   Hardening of the aorta (main artery of the heart) 03/28/2021   Hypothyroidism 03/28/2021   Insomnia 03/28/2021   Lichen sclerosus 03/28/2021   Low back pain 03/28/2021   Migraine 03/28/2021   Mild aortic valve regurgitation 03/28/2021   Osteoporosis 03/28/2021   Other elevated white blood cell count 03/28/2021   Postherpetic neuralgia 03/28/2021   Pulmonary emphysema 03/28/2021   Varicose veins of other specified sites  03/28/2021   Mild aortic stenosis by prior echocardiography 06/02/2020   Bilateral leg pain 05/24/2020   Hyperlipidemia LDL goal <100 03/20/2020   Dry eyes 02/11/2019   Abnormal chest x-ray 07/17/2018   Chronic hoarseness 04/07/2018   Gastroesophageal reflux disease 04/07/2018   Oropharyngeal dysphagia 04/07/2018   Upper airway cough syndrome 03/28/2017   Dyspnea on exertion 10/31/2016   Exercise intolerance 10/31/2016   Essential hypertension 10/31/2016   Memory loss 06/06/2015   Posterior vitreous detachment of left eye 02/02/2015   Exudative age-related macular degeneration 08/19/2013   Pseudophakia 08/04/2013   Paresthesia 01/13/2013   Expected blood loss anemia 11/19/2012   S/P right TKA 11/17/2012   Chest pain with low risk for cardiac etiology 02/28/2012   Nuclear cataract 01/01/2012   Arthritis, hip 10/04/2011   Scoliosis 10/04/2011   Past Medical History:  Diagnosis Date   Arthritis    Left hip   Cataracts, bilateral    Followed by Dr. Gwendalyn Ege & Dr. Rudene Christians @ [facility].   Chronic low back pain    DOE (dyspnea on exertion) 2013   A) 2013-nonischemic Myoview; January 2019-CPX  - Normal.  Echo 01/2017 - Normal   Esophageal reflux    GERD (gastroesophageal reflux disease)    H/O scoliosis    Hypercholesterolemia    Hypothyroidism    Insomnia    Lichen sclerosus    Memory difficulty    Mild aortic stenosis by prior echocardiogram    Echo 04/13/2020: Mild aortic valve stenosis with mean gradient of 8.3 mmHg   PONV (postoperative nausea and vomiting)    Tortuous colon    UTI (lower urinary tract infection)    Varicose veins of both lower extremities with pain    h/x varicose veins - with endoscopic vein ablation    Family History  Problem Relation Age of Onset   Heart disease Father        Later onset CAD and heart failure   Cancer Mother        Liver and pancreas   Hypertension Sister    Hyperlipidemia Brother     Past Surgical History:  Procedure  Laterality Date   CARDIOPULMONARY EXERCISE TEST  05/2017   : Relatively unremarkable.  Excellent exercise tolerance.   CATARACT EXTRACTION Bilateral 2016   Left-sided surgery was unsuccessful.   CHOLECYSTECTOMY  1976   CORONARY CALCIUM SCORE  04/17/2020   Calcium score 528, moderate stenosis of CAD-RADS 3.  CT FFR evaluation of Left Main disease was 0.97.  LAD was 0.90, LCx 0.96  and RCA 0.92.  (All nonsignificant nonobstructive).  Mild calcified plaque in the proximal LAD 25 and 50%.  Aortic atherosclerosis noted.   CORONARY CT ANGIOGRAM  11/20/2016   Coronary Calcium Score 367 (HIGH). <30% calcified proximal LAD, otherwise no significant disease.  Calcification of the Aortic Valve base near Left Main.  No significant noncardiac findings   ENDOSCOPIC VEIN LASER TREATMENT     EYE MUSCLE SURGERY Left    FOOT NEUROMA SURGERY Right    NM MYOVIEW LTD  02/2012   No evidence of ischemia or infarction. Normal ejection fraction.   TONSILLECTOMY AND ADENOIDECTOMY  1938   TOTAL HIP ARTHROPLASTY Left 2000   Left   TOTAL KNEE ARTHROPLASTY Right 11/17/2012   Procedure: RIGHT TOTAL KNEE ARTHROPLASTY;  Surgeon: [name], MD;  Location: [facility];  Service: Orthopedics;  Laterality: Right;   TRANSTHORACIC ECHOCARDIOGRAM  01/14/2017    EF 55 to 60%.  GR 1 DD.  Aortic valve sclerosis with mild AI.  Mild TR.   TRANSTHORACIC ECHOCARDIOGRAM  04/13/2020   EF 60 to 70%.  Mild LVH with GR 1 DD.  Trivial small pericardial effusion, mild aortic valve stenosis with mean gradient of 8.3 mmHg   Social History   Occupational History    Employer: RETIRED    Comment: [employer]  Tobacco Use   Smoking status: Never   Smokeless tobacco: Never  Substance and Sexual Activity   Alcohol use: No   Drug use: No   Sexual activity: Not Currently

## 2022-09-26 ENCOUNTER — Encounter: Payer: Medicare Other | Admitting: Podiatry

## 2022-10-04 ENCOUNTER — Other Ambulatory Visit (INDEPENDENT_AMBULATORY_CARE_PROVIDER_SITE_OTHER): Payer: Medicare Other

## 2022-10-04 ENCOUNTER — Encounter: Payer: Self-pay | Admitting: Physician Assistant

## 2022-10-04 ENCOUNTER — Ambulatory Visit (INDEPENDENT_AMBULATORY_CARE_PROVIDER_SITE_OTHER): Payer: Medicare Other | Admitting: Physician Assistant

## 2022-10-04 DIAGNOSIS — S92351A Displaced fracture of fifth metatarsal bone, right foot, initial encounter for closed fracture: Secondary | ICD-10-CM | POA: Diagnosis not present

## 2022-10-04 NOTE — Progress Notes (Signed)
Office Visit Note   Patient: Summer Davis           Date of Birth: Feb 20, 1931           MRN: [id number] Visit Date: 10/04/2022              Requested by: Tally Joe, MD [phone] [address] [place],  [place] [address] PCP: Tally Joe, MD  Chief Complaint  Patient presents with   Right Foot - Follow-up    5th MT fracture      HPI: Summer Davis is a pleasant 89 year old woman who I have been following for a base of fifth metatarsal fracture of her right foot.  She is now almost 3 months since the injury.  With regards to that she is feeling less pain.  She has been was weaned out of her cam boot and into a shoe.  Unfortunately she had the return of some pain and swelling yesterday.  But where she focuses on the pain is a small ulcer between her great toe and second toe.  She has a history of bunion deformity and crowding of the great toe and second toe.  Prior to this fracture she was seen by a podiatrist who would recommended amputation of the second toe.  Assessment & Plan: Visit Diagnoses:  1. Closed fracture of base of fifth metatarsal bone of right foot     Plan: I had a long discussion with her and her son today.  I think from the fracture standpoint she is doing well.  Probably offloading her foot to the medial side cause her some pain.  They would like to revisit solutions for the second toe.  I did give her some spacers to try she does have a small ulcer but no evidence of any cellulitis or infection.  She will continue to wash this daily and use a spacer.  They would prefer to be seen here for recommendations on her toe rather than go back to the podiatrist.  I will refer them to Dr. Lajoyce Corners he can see her next week.  Follow-Up Instructions: No follow-ups on file.   Ortho Exam  Patient is alert, oriented, no adenopathy, well-dressed, normal affect, normal respiratory effort. She has a palpable dorsalis pedis pulse foot is warm.  She does have moderate bunion  deformity.  She has a small ulcer at the interface of her second toe in her great toe.  No erythema no drainage no foul odor no ascending cellulitis.  She has minimal tenderness to deep palpation over the base of the fifth metatarsal.  She has some stiffness with ankle range of motion  Imaging: No results found. No images are attached to the encounter.  Labs: Lab Results  Component Value Date   ESRSEDRATE 5 06/15/2013   ESRSEDRATE 9 01/13/2013   CRP 0.3 06/15/2013   CRP 0.9 01/13/2013     Lab Results  Component Value Date   ALBUMIN 4.6 09/27/2021   ALBUMIN 4.2 12/08/2020    No results found for: "MG" No results found for: "VD25OH"  No results found for: "PREALBUMIN"    Latest Ref Rng & Units 09/27/2021    9:30 AM 06/09/2017    2:25 PM 02/28/2017   11:18 AM  CBC EXTENDED  WBC 3.4 - 10.8 x10E3/uL 6.1  7.0  6.1   RBC 3.77 - 5.28 x10E6/uL 5.18  4.68  5.13   Hemoglobin 11.1 - 15.9 g/dL 78.2  95.6  21.3   HCT 34.0 -  46.6 % 44.3  39.7  44.2   Platelets 150 - 450 x10E3/uL 151  263.0  274.0   NEUT# 1.4 - 7.0 x10E3/uL 4.3  4.7  4.0   Lymph# 0.7 - 3.1 x10E3/uL 1.2  1.3  1.4      There is no height or weight on file to calculate BMI.  Orders:  Orders Placed This Encounter  Procedures   XR Foot Complete Right   No orders of the defined types were placed in this encounter.    Procedures: No procedures performed  Clinical Data: No additional findings.  ROS:  All other systems negative, except as noted in the HPI. Review of Systems  Objective: Vital Signs: There were no vitals taken for this visit.  Specialty Comments:  No specialty comments available.  PMFS History: Patient Active Problem List   Diagnosis Date Noted   Closed fracture of base of fifth metatarsal bone of right foot 07/26/2022   Rhinorrhea 05/28/2022   Contusion of right orbit 11/22/2021   Gait abnormality 09/27/2021   Restless leg syndrome 09/27/2021   Post herpetic neuralgia 09/27/2021    Other symptoms and signs concerning food and fluid intake 09/27/2021   Acute cystitis 07/13/2021   Dysuria 07/13/2021   Elevated blood-pressure reading without diagnosis of hypertension 07/13/2021   Allergic rhinitis 03/28/2021   Atrophic vaginitis 03/28/2021   Grief 03/28/2021   Hardening of the aorta (main artery of the heart) (HCC) 03/28/2021   Hypothyroidism 03/28/2021   Insomnia 03/28/2021   Lichen sclerosus 03/28/2021   Low back pain 03/28/2021   Migraine 03/28/2021   Mild aortic valve regurgitation 03/28/2021   Osteoporosis 03/28/2021   Other elevated white blood cell count 03/28/2021   Postherpetic neuralgia 03/28/2021   Pulmonary emphysema (HCC) 03/28/2021   Varicose veins of other specified sites 03/28/2021   Mild aortic stenosis by prior echocardiography 06/02/2020   Bilateral leg pain 05/24/2020   Hyperlipidemia LDL goal <100 03/20/2020   Dry eyes 02/11/2019   Abnormal chest x-ray 07/17/2018   Chronic hoarseness 04/07/2018   Gastroesophageal reflux disease 04/07/2018   Oropharyngeal dysphagia 04/07/2018   Upper airway cough syndrome 03/28/2017   Dyspnea on exertion 10/31/2016   Exercise intolerance 10/31/2016   Essential hypertension 10/31/2016   Memory loss 06/06/2015   Posterior vitreous detachment of left eye 02/02/2015   Exudative age-related macular degeneration (HCC) 08/19/2013   Pseudophakia 08/04/2013   Paresthesia 01/13/2013   Expected blood loss anemia 11/19/2012   S/P right TKA 11/17/2012   Chest pain with low risk for cardiac etiology 02/28/2012   Nuclear cataract 01/01/2012   Arthritis, hip 10/04/2011   Scoliosis 10/04/2011   Past Medical History:  Diagnosis Date   Arthritis    Left hip   Cataracts, bilateral    Followed by Dr. Gwendalyn Ege & Dr. Rudene Christians @ [facility].   Chronic low back pain    DOE (dyspnea on exertion) 2013   A) 2013-nonischemic Myoview; January 2019-CPX  - Normal.  Echo 01/2017 - Normal   Esophageal reflux    GERD  (gastroesophageal reflux disease)    H/O scoliosis    Hypercholesterolemia    Hypothyroidism    Insomnia    Lichen sclerosus    Memory difficulty    Mild aortic stenosis by prior echocardiogram    Echo 04/13/2020: Mild aortic valve stenosis with mean gradient of 8.3 mmHg   PONV (postoperative nausea and vomiting)    Tortuous colon    UTI (lower urinary tract infection)  Varicose veins of both lower extremities with pain    h/x varicose veins - with endoscopic vein ablation    Family History  Problem Relation Age of Onset   Heart disease Father        Later onset CAD and heart failure   Cancer Mother        Liver and pancreas   Hypertension Sister    Hyperlipidemia Brother     Past Surgical History:  Procedure Laterality Date   CARDIOPULMONARY EXERCISE TEST  05/2017   : Relatively unremarkable.  Excellent exercise tolerance.   CATARACT EXTRACTION Bilateral 2016   Left-sided surgery was unsuccessful.   CHOLECYSTECTOMY  1976   CORONARY CALCIUM SCORE  04/17/2020   Calcium score 528, moderate stenosis of CAD-RADS 3.  CT FFR evaluation of Left Main disease was 0.97.  LAD was 0.90, LCx 0.96 and RCA 0.92.  (All nonsignificant nonobstructive).  Mild calcified plaque in the proximal LAD 25 and 50%.  Aortic atherosclerosis noted.   CORONARY CT ANGIOGRAM  11/20/2016   Coronary Calcium Score 367 (HIGH). <30% calcified proximal LAD, otherwise no significant disease.  Calcification of the Aortic Valve base near Left Main.  No significant noncardiac findings   ENDOSCOPIC VEIN LASER TREATMENT     EYE MUSCLE SURGERY Left    FOOT NEUROMA SURGERY Right    NM MYOVIEW LTD  02/2012   No evidence of ischemia or infarction. Normal ejection fraction.   TONSILLECTOMY AND ADENOIDECTOMY  1938   TOTAL HIP ARTHROPLASTY Left 2000   Left   TOTAL KNEE ARTHROPLASTY Right 11/17/2012   Procedure: RIGHT TOTAL KNEE ARTHROPLASTY;  Surgeon: [name], MD;  Location: [facility];  Service: Orthopedics;  Laterality:  Right;   TRANSTHORACIC ECHOCARDIOGRAM  01/14/2017    EF 55 to 60%.  GR 1 DD.  Aortic valve sclerosis with mild AI.  Mild TR.   TRANSTHORACIC ECHOCARDIOGRAM  04/13/2020   EF 60 to 70%.  Mild LVH with GR 1 DD.  Trivial small pericardial effusion, mild aortic valve stenosis with mean gradient of 8.3 mmHg   Social History   Occupational History    Employer: RETIRED    Comment: [employer]  Tobacco Use   Smoking status: Never   Smokeless tobacco: Never  Substance and Sexual Activity   Alcohol use: No   Drug use: No   Sexual activity: Not Currently

## 2022-10-10 ENCOUNTER — Ambulatory Visit (INDEPENDENT_AMBULATORY_CARE_PROVIDER_SITE_OTHER): Payer: Medicare Other | Admitting: Orthopedic Surgery

## 2022-10-10 DIAGNOSIS — L97511 Non-pressure chronic ulcer of other part of right foot limited to breakdown of skin: Secondary | ICD-10-CM | POA: Diagnosis not present

## 2022-10-13 ENCOUNTER — Encounter: Payer: Self-pay | Admitting: Orthopedic Surgery

## 2022-10-13 NOTE — Progress Notes (Signed)
Office Visit Note   Patient: Summer Davis           Date of Birth: January 04, 1931           MRN: [id number] Visit Date: 10/10/2022              Requested by: Tally Joe, MD [phone] [address] [place],  [place] [address] PCP: Tally Joe, MD  Chief Complaint  Patient presents with   Right Foot - Follow-up      HPI: Patient is an 89 year old woman who is seen for evaluation for ulcer first webspace second toe.  Patient states the spacer that she has seems to cause more pressure.  Patient states that podiatry is recommended amputation.  Assessment & Plan: Visit Diagnoses:  1. Non-pressure chronic ulcer of other part of right foot limited to breakdown of skin (HCC)     Plan: Patient was given a thin spacer to place in the first webspace.  Recommended a wider shoe like new balance walking sneaker.  Follow-Up Instructions: Return if symptoms worsen or fail to improve.   Ortho Exam  Patient is alert, oriented, no adenopathy, well-dressed, normal affect, normal respiratory effort. Examination patient has a good dorsalis pedis pulse.  The great toe is overlapping the second toe and there is a 2 mm ulcer of the first webspace second toe secondary to pressure.  There is no cellulitis no drainage no exposed bone or tendon.  Imaging: No results found. No images are attached to the encounter.  Labs: Lab Results  Component Value Date   ESRSEDRATE 5 06/15/2013   ESRSEDRATE 9 01/13/2013   CRP 0.3 06/15/2013   CRP 0.9 01/13/2013     Lab Results  Component Value Date   ALBUMIN 4.6 09/27/2021   ALBUMIN 4.2 12/08/2020    No results found for: "MG" No results found for: "VD25OH"  No results found for: "PREALBUMIN"    Latest Ref Rng & Units 09/27/2021    9:30 AM 06/09/2017    2:25 PM 02/28/2017   11:18 AM  CBC EXTENDED  WBC 3.4 - 10.8 x10E3/uL 6.1  7.0  6.1   RBC 3.77 - 5.28 x10E6/uL 5.18  4.68  5.13   Hemoglobin 11.1 - 15.9 g/dL 86.5  78.4  69.6   HCT 34.0 -  46.6 % 44.3  39.7  44.2   Platelets 150 - 450 x10E3/uL 151  263.0  274.0   NEUT# 1.4 - 7.0 x10E3/uL 4.3  4.7  4.0   Lymph# 0.7 - 3.1 x10E3/uL 1.2  1.3  1.4      There is no height or weight on file to calculate BMI.  Orders:  No orders of the defined types were placed in this encounter.  No orders of the defined types were placed in this encounter.    Procedures: No procedures performed  Clinical Data: No additional findings.  ROS:  All other systems negative, except as noted in the HPI. Review of Systems  Objective: Vital Signs: There were no vitals taken for this visit.  Specialty Comments:  No specialty comments available.  PMFS History: Patient Active Problem List   Diagnosis Date Noted   Closed fracture of base of fifth metatarsal bone of right foot 07/26/2022   Rhinorrhea 05/28/2022   Contusion of right orbit 11/22/2021   Gait abnormality 09/27/2021   Restless leg syndrome 09/27/2021   Post herpetic neuralgia 09/27/2021   Other symptoms and signs concerning food and fluid intake 09/27/2021  Acute cystitis 07/13/2021   Dysuria 07/13/2021   Elevated blood-pressure reading without diagnosis of hypertension 07/13/2021   Allergic rhinitis 03/28/2021   Atrophic vaginitis 03/28/2021   Grief 03/28/2021   Hardening of the aorta (main artery of the heart) (HCC) 03/28/2021   Hypothyroidism 03/28/2021   Insomnia 03/28/2021   Lichen sclerosus 03/28/2021   Low back pain 03/28/2021   Migraine 03/28/2021   Mild aortic valve regurgitation 03/28/2021   Osteoporosis 03/28/2021   Other elevated white blood cell count 03/28/2021   Postherpetic neuralgia 03/28/2021   Pulmonary emphysema (HCC) 03/28/2021   Varicose veins of other specified sites 03/28/2021   Mild aortic stenosis by prior echocardiography 06/02/2020   Bilateral leg pain 05/24/2020   Hyperlipidemia LDL goal <100 03/20/2020   Dry eyes 02/11/2019   Abnormal chest x-ray 07/17/2018   Chronic hoarseness  04/07/2018   Gastroesophageal reflux disease 04/07/2018   Oropharyngeal dysphagia 04/07/2018   Upper airway cough syndrome 03/28/2017   Dyspnea on exertion 10/31/2016   Exercise intolerance 10/31/2016   Essential hypertension 10/31/2016   Memory loss 06/06/2015   Posterior vitreous detachment of left eye 02/02/2015   Exudative age-related macular degeneration (HCC) 08/19/2013   Pseudophakia 08/04/2013   Paresthesia 01/13/2013   Expected blood loss anemia 11/19/2012   S/P right TKA 11/17/2012   Chest pain with low risk for cardiac etiology 02/28/2012   Nuclear cataract 01/01/2012   Arthritis, hip 10/04/2011   Scoliosis 10/04/2011   Past Medical History:  Diagnosis Date   Arthritis    Left hip   Cataracts, bilateral    Followed by Dr. Gwendalyn Ege & Dr. Rudene Christians @ [facility].   Chronic low back pain    DOE (dyspnea on exertion) 2013   A) 2013-nonischemic Myoview; January 2019-CPX  - Normal.  Echo 01/2017 - Normal   Esophageal reflux    GERD (gastroesophageal reflux disease)    H/O scoliosis    Hypercholesterolemia    Hypothyroidism    Insomnia    Lichen sclerosus    Memory difficulty    Mild aortic stenosis by prior echocardiogram    Echo 04/13/2020: Mild aortic valve stenosis with mean gradient of 8.3 mmHg   PONV (postoperative nausea and vomiting)    Tortuous colon    UTI (lower urinary tract infection)    Varicose veins of both lower extremities with pain    h/x varicose veins - with endoscopic vein ablation    Family History  Problem Relation Age of Onset   Heart disease Father        Later onset CAD and heart failure   Cancer Mother        Liver and pancreas   Hypertension Sister    Hyperlipidemia Brother     Past Surgical History:  Procedure Laterality Date   CARDIOPULMONARY EXERCISE TEST  05/2017   : Relatively unremarkable.  Excellent exercise tolerance.   CATARACT EXTRACTION Bilateral 2016   Left-sided surgery was unsuccessful.   CHOLECYSTECTOMY  1976    CORONARY CALCIUM SCORE  04/17/2020   Calcium score 528, moderate stenosis of CAD-RADS 3.  CT FFR evaluation of Left Main disease was 0.97.  LAD was 0.90, LCx 0.96 and RCA 0.92.  (All nonsignificant nonobstructive).  Mild calcified plaque in the proximal LAD 25 and 50%.  Aortic atherosclerosis noted.   CORONARY CT ANGIOGRAM  11/20/2016   Coronary Calcium Score 367 (HIGH). <30% calcified proximal LAD, otherwise no significant disease.  Calcification of the Aortic Valve base near Left Main.  No  significant noncardiac findings   ENDOSCOPIC VEIN LASER TREATMENT     EYE MUSCLE SURGERY Left    FOOT NEUROMA SURGERY Right    NM MYOVIEW LTD  02/2012   No evidence of ischemia or infarction. Normal ejection fraction.   TONSILLECTOMY AND ADENOIDECTOMY  1938   TOTAL HIP ARTHROPLASTY Left 2000   Left   TOTAL KNEE ARTHROPLASTY Right 11/17/2012   Procedure: RIGHT TOTAL KNEE ARTHROPLASTY;  Surgeon: [name], MD;  Location: [facility];  Service: Orthopedics;  Laterality: Right;   TRANSTHORACIC ECHOCARDIOGRAM  01/14/2017    EF 55 to 60%.  GR 1 DD.  Aortic valve sclerosis with mild AI.  Mild TR.   TRANSTHORACIC ECHOCARDIOGRAM  04/13/2020   EF 60 to 70%.  Mild LVH with GR 1 DD.  Trivial small pericardial effusion, mild aortic valve stenosis with mean gradient of 8.3 mmHg   Social History   Occupational History    Employer: RETIRED    Comment: [employer]  Tobacco Use   Smoking status: Never   Smokeless tobacco: Never  Substance and Sexual Activity   Alcohol use: No   Drug use: No   Sexual activity: Not Currently

## 2022-11-12 ENCOUNTER — Ambulatory Visit (INDEPENDENT_AMBULATORY_CARE_PROVIDER_SITE_OTHER): Payer: Medicare Other | Admitting: Podiatry

## 2022-11-12 DIAGNOSIS — M79674 Pain in right toe(s): Secondary | ICD-10-CM | POA: Diagnosis not present

## 2022-11-12 DIAGNOSIS — M79675 Pain in left toe(s): Secondary | ICD-10-CM

## 2022-11-12 DIAGNOSIS — B351 Tinea unguium: Secondary | ICD-10-CM | POA: Diagnosis not present

## 2022-11-18 ENCOUNTER — Encounter: Payer: Self-pay | Admitting: Podiatry

## 2022-11-18 NOTE — Progress Notes (Signed)
  Subjective:  Patient ID: Summer Davis, female    DOB: 27-Dec-1930,  MRN: [id number]  Summer Davis presents to clinic today for painful elongated mycotic toenails 1-5 bilaterally which are tender when wearing enclosed shoe gear. Pain is relieved with periodic professional debridement.  Chief Complaint  Patient presents with   NAIL CARE    RFC   New problem(s): None.   PCP is Tally Joe, MD.  Allergies  Allergen Reactions   Alendronate Sodium     Other reaction(s): diarrrhea, constipation, difficulty swallowing   Atorvastatin     Other reaction(s): myalgia   Codeine     Other reaction(s): Unknown Other reaction(s): Unknown   Denosumab     Chills and nausea  Other reaction(s): nausea and chills   Ezetimibe     Other reaction(s): fatigue   Oxycodone Nausea And Vomiting   Simvastatin     Other reaction(s): myalgia Other reaction(s): myalgia   Statins     Other Reaction(s): Myalgias    Review of Systems: Negative except as noted in the HPI.  Objective: No changes noted in today's physical examination. There were no vitals filed for this visit. Summer Davis is a pleasant 89 y.o. female WD, WN in NAD. AAO x 3.  Vascular CFT <3 seconds b/l LE. Palpable DP/PT pulses b/l LE. Digital hair absent b/l. Skin temperature gradient WNL b/l. No pain with calf compression b/l. No edema noted b/l. No cyanosis or clubbing noted b/l LE. Varicosities present b/l. No cyanosis or clubbing noted.  Neurologic Normal speech. Oriented to person, place, and time. Protective sensation intact 5/5 intact bilaterally with 10g monofilament b/l. Vibratory sensation intact b/l.  Dermatologic Pedal skin thin, shiny and atrophic b/l LE. No open wounds b/l LE. No interdigital macerations noted b/l LE. Toenails 1-5 b/l elongated, discolored, dystrophic, thickened, crumbly with subungual debris and tenderness to dorsal palpation. Right 2nd digit with pressure noted at medial DIPJ with blanchable  erythema. No edema, no fluctuance.  Orthopedic: Normal muscle strength 5/5 to all lower extremity muscle groups bilaterally. No pain, crepitus or joint limitation noted with ROM b/l LE. HAV with bunion bilaterally and hammertoes 2-5 b/l. Patient ambulates independently without assistive aids.   Radiographs: None  Assessment/Plan: 1. Pain due to onychomycosis of toenails of both feet   -Consent given for treatment as described below: -Examined patient. -Continue supportive shoe gear daily. -Toenails 1-5 bilaterally were debrided in length and girth with sterile nail nippers and dremel. Pinpoint bleeding of L 3rd toe addressed with Lumicain Hemostatic Solution, cleansed with alcohol. Triple antibiotic ointment applied. Patient/careigver instructed to apply Neosporin Cream once daily for 7 days. -Patient/POA to call should there be question/concern in the interim.   Return in about 3 months (around 02/12/2023).  Freddie Breech, DPM

## 2022-12-03 ENCOUNTER — Ambulatory Visit: Payer: Medicare Other | Admitting: Internal Medicine

## 2022-12-17 ENCOUNTER — Emergency Department (HOSPITAL_COMMUNITY): Payer: Medicare Other

## 2022-12-17 ENCOUNTER — Emergency Department (HOSPITAL_COMMUNITY)
Admission: EM | Admit: 2022-12-17 | Discharge: 2022-12-17 | Disposition: A | Discharge status: Home / Self Care | Payer: Medicare Other | Attending: Emergency Medicine | Admitting: Emergency Medicine

## 2022-12-17 ENCOUNTER — Encounter (HOSPITAL_COMMUNITY): Payer: Self-pay

## 2022-12-17 ENCOUNTER — Other Ambulatory Visit: Payer: Self-pay

## 2022-12-17 DIAGNOSIS — M545 Low back pain, unspecified: Secondary | ICD-10-CM | POA: Diagnosis present

## 2022-12-17 DIAGNOSIS — M5442 Lumbago with sciatica, left side: Secondary | ICD-10-CM | POA: Insufficient documentation

## 2022-12-17 MED ORDER — LIDOCAINE 5 % EX PTCH
1.0000 | MEDICATED_PATCH | CUTANEOUS | Status: DC
  Administered 2022-12-17: 1 via TRANSDERMAL
  Filled 2022-12-17: qty 1

## 2022-12-17 MED ORDER — HYDROCODONE-ACETAMINOPHEN 5-325 MG PO TABS: 1.0000 | ORAL_TABLET | ORAL | 0 refills | Status: AC | PRN

## 2022-12-17 NOTE — ED Notes (Signed)
Patient transported to X-ray 

## 2022-12-17 NOTE — ED Provider Notes (Signed)
[place] EMERGENCY DEPARTMENT AT [facility] Provider Note   CSN: [id number] Arrival date & time: 12/17/22  4403     History  Chief Complaint  Patient presents with   Hip Pain    Summer Davis is a 89 y.o. female.  Patient is a 89 year old female who presents with back pain.  She says she has had back pain all her life but recently it has been flaring up over the last week or so.  She has pain in her left lower back and left hip.  She denies any radiation further down the leg.  No numbness or weakness in the extremities.  No recent falls.  No fevers.  No loss of bowel or bladder control.  She was seen by her PCP last week and given Celebrex which she has been taking without improvement in symptoms.  She says it is okay at baseline but she will suddenly have increased sharp pain that is not controlled with the Celebrex.       Home Medications Prior to Admission medications   Medication Sig Start Date End Date Taking? Authorizing Provider  HYDROcodone-acetaminophen (NORCO/VICODIN) 5-325 MG tablet Take 1 tablet by mouth every 4 (four) hours as needed. 12/17/22  Yes Rolan Bucco, MD  albuterol (VENTOLIN HFA) 108 (90 Base) MCG/ACT inhaler Inhale 2 puffs into the lungs every 6 (six) hours as needed for wheezing or shortness of breath. 06/07/22   Byrum, Les Pou, MD  Apoaequorin (PREVAGEN PO) Take by mouth daily.    [provider]  Biotin 10 MG TABS Take 1 tablet daily by mouth.    [provider]  Calcium Carb-Cholecalciferol 600-800 MG-UNIT TABS Take 1 tablet daily by mouth.    [provider]  cholecalciferol (VITAMIN D3) 25 MCG (1000 UNIT) tablet 1 tablet    [provider]  clobetasol ointment (TEMOVATE) 0.05 % SMARTSIG:Sparingly Topical PRN 10/22/19   [provider]  diclofenac Sodium (VOLTAREN) 1 % GEL SMARTSIG:2 Gram(s) Topical 4 Times Daily PRN 09/27/21   [provider]  donepezil (ARICEPT) 5 MG tablet Take 5 mg  by mouth at bedtime. 04/24/22   [provider]  fexofenadine (ALLEGRA) 180 MG tablet See admin instructions.    [provider]  gabapentin (NEURONTIN) 100 MG capsule Take 200 mg by mouth 2 (two) times daily. 04/24/15   [provider]  ipratropium (ATROVENT) 0.03 % nasal spray Place 2 sprays into both nostrils every 12 (twelve) hours. 05/28/22   Leslye Peer, MD  levothyroxine (SYNTHROID) 75 MCG tablet Take 1 tablet by mouth every other day.    [provider]  levothyroxine (SYNTHROID, LEVOTHROID) 88 MCG tablet Take 88 mcg by mouth every other day. MON, WED, FRIDAY    [provider]  lidocaine-prilocaine (EMLA) cream 1 gram tid prn 09/27/21   Levert Feinstein, MD  Multiple Vitamins-Minerals (PRESERVISION AREDS 2) CAPS Take 2 capsules by mouth daily.    [provider]  Polyethyl Glycol-Propyl Glycol 0.4-0.3 % SOLN Place 1 drop into both eyes daily as needed (for dry eyes).    [provider]      Allergies    Alendronate sodium, Atorvastatin, Codeine, Denosumab, Ezetimibe, Oxycodone, Simvastatin, and Statins    Review of Systems   Review of Systems  Constitutional:  Negative for fever.  Gastrointestinal:  Negative for nausea and vomiting.  Musculoskeletal:  Positive for back pain. Negative for arthralgias, joint swelling and neck pain.  Skin:  Negative for wound.  Neurological:  Negative for weakness, numbness and headaches.    Physical Exam Updated Vital Signs BP (!) 167/80 (BP Location: Left Arm)   Pulse 81   Temp 98.1 F (36.7 C) (Oral)   Resp 16   Ht 5\' 1"  (1.549 m)   Wt 47.6 kg   SpO2 97%   BMI 19.84 kg/m  Physical Exam Constitutional:      Appearance: She is well-developed.  HENT:     Head: Normocephalic and atraumatic.  Cardiovascular:     Rate and Rhythm: Normal rate.  Pulmonary:     Effort: Pulmonary effort is normal.  Musculoskeletal:        General: Tenderness present.     Cervical back: Normal  range of motion and neck supple.     Comments: Patient has tenderness to the lower back in the left paraspinal area and over the left sciatic nerve.  Negative straight leg raise bilaterally.  Pedal pulses are intact.  She has normal sensation and motor function to the lower extremities bilaterally.  Patellar reflexes symmetric bilaterally.  Skin:    General: Skin is warm and dry.  Neurological:     Mental Status: She is alert and oriented to person, place, and time.     ED Results / Procedures / Treatments   Labs (all labs ordered are listed, but only abnormal results are displayed) Labs Reviewed - No data to display  EKG None  Radiology DG Hip Unilat W or Wo Pelvis 2-3 Views Left  Result Date: 12/17/2022 CLINICAL DATA:  Pain EXAM: DG HIP (WITH OR WITHOUT PELVIS) 2-3V LEFT COMPARISON:  None Available. 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 There is no demonstrable hardware failure. No fracture or dislocation is seen. In the AP views, there is low-density adjacent to the surgical hardware at the level of proximal shaft of left femur. There is no break in the cortical margins. IMPRESSION: There is previous left hip arthroplasty. No fracture or dislocation is seen. There is lucency around the surgical hardware in the proximal shaft of the left femur. This may be postsurgical change or inflammatory process. There is no break in the cortical margins. If there are continued symptoms, short-term follow-up radiographic examination in 1-2 months may be considered. Electronically Signed   By: Ernie Avena M.D.   On: 12/17/2022 12:21   DG Lumbar Spine Complete  Result Date: 12/17/2022 CLINICAL DATA:  Back pain, left hip pain EXAM: LUMBAR SPINE - COMPLETE 4+ VIEW COMPARISON:  10/04/2011 FINDINGS: No recent fracture is seen. Alignment of posterior margins of vertebral bodies is unremarkable. There is rotoscoliosis with convexity to the right. Degenerative changes are noted with bony spurs  and facet hypertrophy at multiple levels. There is no narrowing of the disc space in lower lumbar spine. Last lumbar vertebra is transitional. There is interval progression of degenerative changes. There is previous left hip arthroplasty. Surgical clips are seen in gallbladder fossa. IMPRESSION: No recent fracture is seen. Moderate to marked degenerative changes are noted, more so in the lower lumbar spine. Rotoscoliosis with convexity to the right. Electronically Signed   By: Ernie Avena M.D.   On: 12/17/2022 12:16    Procedures Procedures    Medications Ordered in ED Medications  lidocaine (LIDODERM) 5 % 1 patch (1 patch Transdermal Patch Applied 12/17/22 1144)    ED Course/ Medical Decision Making/ A&P  Medical Decision Making Amount and/or Complexity of Data Reviewed Radiology: ordered.  Risk Prescription drug management.   Patient is a 89 year old female who presents with back pain.  She has some radicular symptoms to her left hip.  No neurologic deficits or signs of cauda equina.  No fevers.  No recent falls.  X-rays were done of the lumbar sacral spine and the left hip.  These were interpreted by me and confirmed by the radiologist.  No obvious fractures are identified.  There is a questionable lucency adjacent to the hardware in her left hip.  Her pain is mostly in the back so would doubt that this is a real fracture.  She has limited pain with range of motion of the hip joint.  She is neurologically intact.  Lidoderm patch was placed although she says currently she is not having much pain.  She says it mostly just with certain movements and will flareup.  She request some stronger pain medication.  She will continue the Celebrex.  Was given a prescription for a few tablets of hydrocodone that she can use for breakthrough pain.  She was discharged home in good condition.  She has an appointment on Wednesday to follow-up with her primary care doctor.   Return precautions were given.  Final Clinical Impression(s) / ED Diagnoses Final diagnoses:  Acute midline low back pain with left-sided sciatica    Rx / DC Orders ED Discharge Orders          Ordered    HYDROcodone-acetaminophen (NORCO/VICODIN) 5-325 MG tablet  Every 4 hours PRN        12/17/22 1326              Rolan Bucco, MD 12/17/22 1339

## 2022-12-17 NOTE — ED Triage Notes (Signed)
Patient report left hip pain x "a while". Pain radiates down leg. Seen by PCP and given celecoxib without relief.

## 2023-01-21 ENCOUNTER — Ambulatory Visit (INDEPENDENT_AMBULATORY_CARE_PROVIDER_SITE_OTHER): Payer: Medicare Other | Admitting: Podiatry

## 2023-01-21 ENCOUNTER — Encounter: Payer: Self-pay | Admitting: Podiatry

## 2023-01-21 DIAGNOSIS — M2011 Hallux valgus (acquired), right foot: Secondary | ICD-10-CM

## 2023-01-21 DIAGNOSIS — M79675 Pain in left toe(s): Secondary | ICD-10-CM | POA: Diagnosis not present

## 2023-01-21 DIAGNOSIS — M79674 Pain in right toe(s): Secondary | ICD-10-CM

## 2023-01-21 DIAGNOSIS — M2041 Other hammer toe(s) (acquired), right foot: Secondary | ICD-10-CM

## 2023-01-21 DIAGNOSIS — B351 Tinea unguium: Secondary | ICD-10-CM | POA: Diagnosis not present

## 2023-01-21 DIAGNOSIS — M21611 Bunion of right foot: Secondary | ICD-10-CM

## 2023-01-21 NOTE — Progress Notes (Signed)
This patient presents to the office with chief complaint of long thick painful nails.  Patient says the nails are painful walking and wearing shoes.  This patient is unable to self treat.  This patient is unable to trim her nails since she is unable to reach her nails. She has painful corn on her second toe right foot adjoining her bunion. She presents to the office for preventative foot care services.  General Appearance  Alert, conversant and in no acute stress.  Vascular  Dorsalis pedis and posterior tibial  pulses are  weakly palpable  bilaterally.  Capillary return is within normal limits  bilaterally. Temperature is within normal limits  bilaterally.  Neurologic  Senn-Weinstein monofilament wire test within normal limits  bilaterally. Muscle power within normal limits bilaterally.  Nails Thick disfigured discolored nails with subungual debris  from hallux to fifth toes bilaterally. No evidence of bacterial infection or drainage bilaterally.  Orthopedic  No limitations of motion  feet .  No crepitus or effusions noted.  HAV  B/L.  Hammer toe second toe right foot.  Skin  normotropic skin with no porokeratosis noted bilaterally.  No signs of infections or ulcers noted.     Onychomycosis  Nails  B/L.  Pain in right toes  Pain in left toes  Corn  Debridement of nails both feet followed trimming the nails with dremel tool.  Patient dispensed padding and voltaren cream for painful corn.   RTC 3 months.   Helane Gunther DPM

## 2023-04-02 ENCOUNTER — Ambulatory Visit: Payer: Medicare Other | Admitting: Podiatry

## 2023-04-02 DIAGNOSIS — M79675 Pain in left toe(s): Secondary | ICD-10-CM | POA: Diagnosis not present

## 2023-04-02 DIAGNOSIS — B351 Tinea unguium: Secondary | ICD-10-CM | POA: Diagnosis not present

## 2023-04-02 DIAGNOSIS — M79674 Pain in right toe(s): Secondary | ICD-10-CM

## 2023-04-09 ENCOUNTER — Encounter: Payer: Self-pay | Admitting: Podiatry

## 2023-04-09 NOTE — Progress Notes (Signed)
  Subjective:  Patient ID: Summer Davis, female    DOB: 1930/06/07,  MRN: [id number]  89 y.o. female presents with painful thick toenails that are difficult to trim. Pain interferes with ambulation. Aggravating factors include wearing enclosed shoe gear. Pain is relieved with periodic professional debridement. Chief Complaint  Patient presents with   RFC    RFC    PCP: Tally Joe, MD.  New problem(s): None.   Review of Systems: Negative except as noted in the HPI.   Allergies  Allergen Reactions   Alendronate Sodium     Other reaction(s): diarrrhea, constipation, difficulty swallowing   Atorvastatin     Other reaction(s): myalgia   Codeine     Other reaction(s): Unknown Other reaction(s): Unknown   Denosumab     Chills and nausea  Other reaction(s): nausea and chills   Ezetimibe     Other reaction(s): fatigue   Oxycodone Nausea And Vomiting   Simvastatin     Other reaction(s): myalgia Other reaction(s): myalgia   Statins     Other Reaction(s): Myalgias    Objective:  There were no vitals filed for this visit. Constitutional Patient is a pleasant 89 y.o. female WD, WN in NAD. AAO x 3.  Vascular Capillary fill time to digits <3 seconds.  DP/PT pulse(s) are faintly palpable b/l lower extremities. Pedal hair absent b/l. No pain with calf compression b/l. No cyanosis or clubbing noted. No ischemia nor gangrene noted b/l. Pedal hair absent. No edema noted b/l LE. Varicosities present b/l.  Neurologic Protective sensation intact 5/5 intact bilaterally with 10g monofilament b/l. Vibratory sensation intact b/l. No clonus b/l.   Dermatologic Pedal skin is thin, shiny and atrophic b/l.  No open wounds b/l lower extremities. No interdigital macerations b/l lower extremities. Toenails 1-5 b/l elongated, discolored, dystrophic, thickened, crumbly with subungual debris and tenderness to dorsal palpation. No hyperkeratotic nor porokeratotic lesions present on today's visit.   Orthopedic: Normal muscle strength 5/5 to all lower extremity muscle groups bilaterally. HAV with bunion deformity noted b/l LE. Hammertoe deformity noted 2-5 b/l.   Assessment:   1. Pain due to onychomycosis of toenails of both feet    Plan:  Patient was evaluated and treated. All patient's and/or POA's questions/concerns addressed on today's visit. Toenails 1-5 debrided in length and girth without incident. Continue soft, supportive shoe gear daily. Report any pedal injuries to medical professional. Call office if there are any questions/concerns. -Patient/POA to call should there be question/concern in the interim.  Return in about 3 months (around 07/03/2023).  Freddie Breech, DPM      [place] LOCATION: [address], [place] [address]                   Office [phone]   [facility] LOCATION: [address] [place], [place] [address] Office [phone]

## 2023-05-30 ENCOUNTER — Other Ambulatory Visit: Payer: Self-pay | Admitting: Emergency Medicine

## 2023-08-05 ENCOUNTER — Encounter: Payer: Self-pay | Admitting: Podiatry

## 2023-08-05 ENCOUNTER — Ambulatory Visit (INDEPENDENT_AMBULATORY_CARE_PROVIDER_SITE_OTHER): Payer: Medicare Other | Admitting: Podiatry

## 2023-08-05 DIAGNOSIS — M79674 Pain in right toe(s): Secondary | ICD-10-CM | POA: Diagnosis not present

## 2023-08-05 DIAGNOSIS — B351 Tinea unguium: Secondary | ICD-10-CM

## 2023-08-05 DIAGNOSIS — M79675 Pain in left toe(s): Secondary | ICD-10-CM

## 2023-08-10 NOTE — Progress Notes (Signed)
  Subjective:  Patient ID: Summer Davis, female    DOB: 1930/06/28,  MRN: [id number]  Summer Davis presents to clinic today for painful, elongated thickened toenails x 10 which are symptomatic when wearing enclosed shoe gear. This interferes with his/her daily activities.  Chief Complaint  Patient presents with   Nail Problem    RFC   New problem(s): None.   PCP is Tally Joe, MD.  Allergies  Allergen Reactions   Alendronate Sodium     Other reaction(s): diarrrhea, constipation, difficulty swallowing   Atorvastatin     Other reaction(s): myalgia   Codeine     Other reaction(s): Unknown Other reaction(s): Unknown   Denosumab     Chills and nausea  Other reaction(s): nausea and chills   Ezetimibe     Other reaction(s): fatigue   Oxycodone Nausea And Vomiting   Simvastatin     Other reaction(s): myalgia Other reaction(s): myalgia   Statins     Other Reaction(s): Myalgias    Review of Systems: Negative except as noted in the HPI.  Objective: No changes noted in today's physical examination. There were no vitals filed for this visit. Summer Davis is a pleasant 89 y.o. female WD, WN in NAD. AAO x 3.  Vascular Examination: CFT <3 seconds b/l. DP/PT pulses faintly palpable b/l. Skin temperature gradient warm to warm b/l. No pain with calf compression. No ischemia or gangrene. No cyanosis or clubbing noted b/l. Pedal hair absent. Varicosities present b/l.   Neurological Examination: Sensation grossly intact b/l with 10 gram monofilament. Vibratory sensation intact b/l.   Dermatological Examination: Pedal skin warm and supple b/l.   No open wounds. No interdigital macerations.  Toenails 1-5 b/l thick, discolored, elongated with subungual debris and pain on dorsal palpation.    No corns, calluses nor porokeratotic lesions noted.  Musculoskeletal Examination: Muscle strength 5/5 to all lower extremity muscle groups bilaterally. HAV with bunion bilaterally  and hammertoes 2-5 b/l.  Radiographs: None  Assessment/Plan: 1. Pain due to onychomycosis of toenails of both feet   Consent given for treatment. Patient examined. All patient's and/or POA's questions/concerns addressed on today's visit. Mycotic toenails 1-5 debrided in length and girth without incident. Continue soft, supportive shoe gear daily. Report any pedal injuries to medical professional. Call office if there are any quesitons/concerns. -Patient/POA to call should there be question/concern in the interim.   Return in about 3 months (around 11/05/2023).  Freddie Breech, DPM      [place] LOCATION: [address] [place] [address]                   Office [phone]   [facility] LOCATION: [address] [place], [place] [address] Office [phone]

## 2023-09-04 ENCOUNTER — Other Ambulatory Visit: Payer: Self-pay | Admitting: Emergency Medicine

## 2023-09-19 ENCOUNTER — Other Ambulatory Visit: Payer: Self-pay | Admitting: Emergency Medicine

## 2023-11-18 ENCOUNTER — Encounter: Payer: Self-pay | Admitting: Podiatry

## 2023-11-18 ENCOUNTER — Ambulatory Visit (INDEPENDENT_AMBULATORY_CARE_PROVIDER_SITE_OTHER): Admitting: Podiatry

## 2023-11-18 DIAGNOSIS — M79674 Pain in right toe(s): Secondary | ICD-10-CM

## 2023-11-18 DIAGNOSIS — M79675 Pain in left toe(s): Secondary | ICD-10-CM

## 2023-11-18 DIAGNOSIS — B351 Tinea unguium: Secondary | ICD-10-CM | POA: Diagnosis not present

## 2023-11-23 ENCOUNTER — Encounter: Payer: Self-pay | Admitting: Podiatry

## 2023-11-23 NOTE — Progress Notes (Signed)
  Subjective:  Patient ID: Summer Davis, female    DOB: 02/08/1931,  MRN: [id number]  Summer Davis presents to clinic today for painful thick toenails that are difficult to trim. Pain interferes with ambulation. Aggravating factors include wearing enclosed shoe gear. Pain is relieved with periodic professional debridement.  Chief Complaint  Patient presents with   RFC    RFC Non diabetic toenail trim. Thick onychomycosis toenails. Peeling skin.   New problem(s): None.   PCP is Seabron Lenis, MD.  Allergies  Allergen Reactions   Alendronate Sodium     Other reaction(s): diarrrhea, constipation, difficulty swallowing   Atorvastatin     Other reaction(s): myalgia   Codeine     Other reaction(s): Unknown Other reaction(s): Unknown   Denosumab     Chills and nausea  Other reaction(s): nausea and chills   Ezetimibe     Other reaction(s): fatigue   Oxycodone Nausea And Vomiting   Simvastatin     Other reaction(s): myalgia Other reaction(s): myalgia   Statins     Other Reaction(s): Myalgias    Review of Systems: Negative except as noted in the HPI.  Objective: No changes noted in today's physical examination. There were no vitals filed for this visit. Summer YURIANNA Davis is a pleasant 89 y.o. female WD, WN in NAD. AAO x 3.  Vascular Examination: CFT <3 seconds b/l. DP/PT pulses faintly palpable b/l. Skin temperature gradient warm to warm b/l. No pain with calf compression. No ischemia or gangrene. No cyanosis or clubbing noted b/l. Pedal hair absent. Varicosities present b/l.   Neurological Examination: Sensation grossly intact b/l with 10 gram monofilament.   Dermatological Examination: Pedal skin warm and supple b/l. Skin mildly dry b/l.  No open wounds. No interdigital macerations.  Toenails 1-5 b/l thick, discolored, elongated with subungual debris and pain on dorsal palpation.    No hyperkeratotic nor porokeratotic lesions present on today's  visit.  Musculoskeletal Examination: Muscle strength 5/5 to all lower extremity muscle groups bilaterally. HAV with bunion deformity noted b/l LE. Hammertoe(s) 2-5 b/l.  Radiographs: None  Assessment/Plan: 1. Pain due to onychomycosis of toenails of both feet   -Patient was evaluated today. All questions/concerns addressed on today's visit. -Patient to continue soft, supportive shoe gear daily. Discussed daily moisturizer for dry skin. -Toenails 1-5 bilaterally were debrided in length and girth with sterile nail nippers and dremel. Pinpoint bleeding of L 4th toe addressed with Lumicain Hemostatic Solution, cleansed with alcohol . No further treatment required by patient/caregiver. -Patient/POA to call should there be question/concern in the interim.   Return in about 3 months (around 02/18/2024).  Delon LITTIE Merlin, DPM      [place] LOCATION: [address], [place] [address]                   Office [phone]   [facility] LOCATION: [address] [place], [place] [address] Office [phone]

## 2023-12-29 ENCOUNTER — Other Ambulatory Visit: Payer: Self-pay | Admitting: Emergency Medicine

## 2024-02-25 ENCOUNTER — Encounter: Payer: Self-pay | Admitting: Podiatry

## 2024-02-25 ENCOUNTER — Ambulatory Visit: Admitting: Podiatry

## 2024-02-25 DIAGNOSIS — B351 Tinea unguium: Secondary | ICD-10-CM | POA: Diagnosis not present

## 2024-02-25 DIAGNOSIS — M79675 Pain in left toe(s): Secondary | ICD-10-CM | POA: Diagnosis not present

## 2024-02-25 DIAGNOSIS — M79674 Pain in right toe(s): Secondary | ICD-10-CM

## 2024-02-29 NOTE — Progress Notes (Signed)
  Subjective:  Patient ID: Summer Davis, female    DOB: 1930/06/30,  MRN: [id number]  Summer Davis presents to clinic today for preventative diabetic foot care for painful elongated mycotic toenails 1-5 bilaterally which are tender when wearing enclosed shoe gear. Pain is relieved with periodic professional debridement.  Chief Complaint  Patient presents with   RFC     RFC Non diabetic toenail trim.LOV with PCP 06/16/23.   New problem(s): None.   PCP is Seabron Lenis, MD.  Allergies  Allergen Reactions   Alendronate Sodium     Other reaction(s): diarrrhea, constipation, difficulty swallowing   Atorvastatin     Other reaction(s): myalgia   Codeine     Other reaction(s): Unknown Other reaction(s): Unknown   Denosumab     Chills and nausea  Other reaction(s): nausea and chills   Ezetimibe     Other reaction(s): fatigue   Oxycodone Nausea And Vomiting   Simvastatin     Other reaction(s): myalgia Other reaction(s): myalgia   Statins     Other Reaction(s): Myalgias    Review of Systems: Negative except as noted in the HPI.  Objective: No changes noted in today's physical examination. There were no vitals filed for this visit. Summer Davis is a pleasant 89 y.o. female WD, WN in NAD. AAO x 3.   Vascular Examination: CFT <3 seconds b/l. DP/PT pulses faintly palpable b/l. Digital hair diminished.  Skin temperature gradient warm to warm b/l. No pain with calf compression. No ischemia or gangrene. No cyanosis or clubbing noted b/l. Varicosities present b/l.   Neurological Examination: Sensation grossly intact b/l with 10 gram monofilament. Vibratory sensation intact b/l.   Dermatological Examination: Pedal skin warm and supple b/l.   No open wounds. No interdigital macerations.  Toenails 1-5 b/l thick, discolored, elongated with subungual debris and pain on dorsal palpation.    No hyperkeratotic nor porokeratotic lesions.  Musculoskeletal Examination: Muscle  strength 5/5 to all lower extremity muscle groups bilaterally. HAV with bunion deformity noted b/l LE. Hammertoe(s) 2-5 b/l.  Radiographs: None Assessment/Plan: 1. Pain due to onychomycosis of toenails of both feet     Consent given for treatment. Patient examined. All patient's and/or POA's questions/concerns addressed on today's visit. Toenails 1-5 b/l debrided in length and girth without incident. Continue soft, supportive shoe gear daily. Report any pedal injuries to medical professional. Call office if there are any questions/concerns. -Patient/POA to call should there be question/concern in the interim.   Return in about 3 months (around 05/27/2024).  Delon LITTIE Merlin, DPM      [place] LOCATION: [address] [place] [address]                   Office [phone]   [facility] LOCATION: [address] [place], [place] [address] Office [phone]

## 2024-03-26 ENCOUNTER — Other Ambulatory Visit: Payer: Self-pay | Admitting: Emergency Medicine

## 2024-04-02 ENCOUNTER — Encounter: Payer: Self-pay | Admitting: Internal Medicine

## 2024-04-02 ENCOUNTER — Ambulatory Visit: Admitting: Internal Medicine

## 2024-04-02 ENCOUNTER — Other Ambulatory Visit

## 2024-04-02 VITALS — BP 144/80 | Ht 61.0 in | Wt 112.0 lb

## 2024-04-02 DIAGNOSIS — M810 Age-related osteoporosis without current pathological fracture: Secondary | ICD-10-CM | POA: Diagnosis not present

## 2024-04-02 NOTE — Progress Notes (Unsigned)
 Name: Summer Davis  MRN/ DOB: [id number], 1930/11/20    Age/ Sex: 89 y.o., female    PCP: Seabron Lenis, MD   Reason for Endocrinology Evaluation: Osteoporosis      Date of Initial Endocrinology Evaluation: 12/08/2020    HPI: Summer Davis is a 89 y.o. female with a past medical history of Osteoporosis, GERD, insomnia and hypothyroidism. The patient presented for initial endocrinology clinic visit on 12/08/2020  for consultative assistance with her Osteoporosis .    Pt was diagnosed with osteoporosis: years ago, her last DXA 2021 with a Tscore at the left radius -5.3   Menarche at age : age 89  Menopausal at age : age 66 Fracture Hx: no Hx of HRT: no  FH of osteoporosis or hip fracture: no Prior Hx of anti-estrogenic therapy : Prior Hx of anti-resorptive therapy : Alendronate- diarrhea, constipation and difficulty swallowing , Prolia - nausea and chills  She declined Forteo in 11/2020 due to daily injections  She received her first Reclast  infusion 12/26/2020, the second injection was August, 2023.  The patient did sustain a fracture of the base of the fifth metatarsal bone while incorrectly landing on her right  foot coming down from the bed in March, 2024.  SUBJECTIVE:   Today (04/02/24): [name] is here for follow-up on osteoporosis.   The patient has not been to our clinic in 28 months  She received her first Reclast  infusion 12/26/2020, and the second was in August, 2023  She did sustain a right fifth toe metatarsal bone fracture coming down from her bed 07/2022 She continues to follow-up with ophthalmology for macular degeneration of the right eye  No polydipsia  No constipation  No heartburn  Rare palpitations  Continues with  chronic back pain    She is on calcium 650 BID  Vitamin D  1000 iu through  [facility]      HISTORY:  Past Medical History:  Past Medical History:  Diagnosis Date   Arthritis    Left hip   Cataracts, bilateral     Followed by Dr. Cheree & Dr. Steffi @ [facility] ophthalmology.   Chronic low back pain    DOE (dyspnea on exertion) 2013   A) 2013-nonischemic Myoview ; January 2019-CPX  - Normal.  Echo 01/2017 - Normal   Esophageal reflux    GERD (gastroesophageal reflux disease)    H/O scoliosis    Hypercholesterolemia    Hypothyroidism    Insomnia    Lichen sclerosus    Memory difficulty    Mild aortic stenosis by prior echocardiogram    Echo 04/13/2020: Mild aortic valve stenosis with mean gradient of 8.3 mmHg   PONV (postoperative nausea and vomiting)    Tortuous colon    UTI (lower urinary tract infection)    Varicose veins of both lower extremities with pain    h/x varicose veins - with endoscopic vein ablation   Past Surgical History:  Past Surgical History:  Procedure Laterality Date   CARDIOPULMONARY EXERCISE TEST  05/2017   : Relatively unremarkable.  Excellent exercise tolerance.   CATARACT EXTRACTION Bilateral 2016   Left-sided surgery was unsuccessful.   CHOLECYSTECTOMY  1976   CORONARY CALCIUM SCORE  04/17/2020   Calcium score 528, moderate stenosis of CAD-RADS 3.  CT FFR evaluation of Left Main disease was 0.97.  LAD was 0.90, LCx 0.96 and RCA 0.92.  (All nonsignificant nonobstructive).  Mild calcified plaque in the proximal LAD 25 and 50%.  Aortic  atherosclerosis noted.   CORONARY CT ANGIOGRAM  11/20/2016   Coronary Calcium Score 367 (HIGH). <30% calcified proximal LAD, otherwise no significant disease.  Calcification of the Aortic Valve base near Left Main.  No significant noncardiac findings   ENDOSCOPIC VEIN LASER TREATMENT     EYE MUSCLE SURGERY Left    FOOT NEUROMA SURGERY Right    NM MYOVIEW  LTD  02/2012   No evidence of ischemia or infarction. Normal ejection fraction.   TONSILLECTOMY AND ADENOIDECTOMY  1938   TOTAL HIP ARTHROPLASTY Left 2000   Left   TOTAL KNEE ARTHROPLASTY Right 11/17/2012   Procedure: RIGHT TOTAL KNEE ARTHROPLASTY;  Surgeon: Donnice JONETTA Car, MD;  Location:  [facility];  Service: Orthopedics;  Laterality: Right;   TRANSTHORACIC ECHOCARDIOGRAM  01/14/2017    EF 55 to 60%.  GR 1 DD.  Aortic valve sclerosis with mild AI.  Mild TR.   TRANSTHORACIC ECHOCARDIOGRAM  04/13/2020   EF 60 to 70%.  Mild LVH with GR 1 DD.  Trivial small pericardial effusion, mild aortic valve stenosis with mean gradient of 8.3 mmHg    Social History:  reports that she has never smoked. She has never used smokeless tobacco. She reports that she does not drink alcohol  and does not use drugs. Family History: family history includes Cancer in her mother; Heart disease in her father; Hyperlipidemia in her brother; Hypertension in her sister.   HOME MEDICATIONS: Allergies as of 04/02/2024       Reactions   Alendronate Sodium    Other reaction(s): diarrrhea, constipation, difficulty swallowing   Atorvastatin    Other reaction(s): myalgia   Codeine    Other reaction(s): Unknown Other reaction(s): Unknown   Denosumab    Chills and nausea  Other reaction(s): nausea and chills   Ezetimibe    Other reaction(s): fatigue   Oxycodone Nausea And Vomiting   Simvastatin    Other reaction(s): myalgia Other reaction(s): myalgia   Statins    Other Reaction(s): Myalgias        Medication List        Accurate as of April 02, 2024 10:52 AM. If you have any questions, ask your nurse or doctor.          albuterol  108 (90 Base) MCG/ACT inhaler Commonly known as: VENTOLIN  HFA Inhale 2 puffs into the lungs every 6 (six) hours as needed for wheezing or shortness of breath.   Biotin 10 MG Tabs Take 1 tablet daily by mouth.   Calcium Carb-Cholecalciferol 600-800 MG-UNIT Tabs Take 1 tablet daily by mouth.   cholecalciferol 25 MCG (1000 UNIT) tablet Commonly known as: VITAMIN D3 1 tablet   clobetasol ointment 0.05 % Commonly known as: TEMOVATE SMARTSIG:Sparingly Topical PRN   diclofenac  Sodium 1 % Gel Commonly known as: VOLTAREN  SMARTSIG:2 Gram(s) Topical 4 Times Daily  PRN   donepezil 5 MG tablet Commonly known as: ARICEPT Take 5 mg by mouth at bedtime.   fexofenadine 180 MG tablet Commonly known as: ALLEGRA See admin instructions.   gabapentin  100 MG capsule Commonly known as: NEURONTIN  Take 200 mg by mouth 2 (two) times daily.   HYDROcodone -acetaminophen  5-325 MG tablet Commonly known as: NORCO/VICODIN Take 1 tablet by mouth every 4 (four) hours as needed.   ipratropium 0.03 % nasal spray Commonly known as: ATROVENT  USE 2 SPRAYS IN EACH NOSTRIL EVERY 12 HOURS   levothyroxine  75 MCG tablet Commonly known as: SYNTHROID  Take 1 tablet by mouth every other day.   levothyroxine  88 MCG tablet Commonly  known as: SYNTHROID  Take 88 mcg by mouth every other day. MON, WED, FRIDAY   lidocaine -prilocaine  cream Commonly known as: EMLA  1 gram tid prn   Polyethyl Glycol-Propyl Glycol 0.4-0.3 % Soln Place 1 drop into both eyes daily as needed (for dry eyes).   PreserVision AREDS 2 Caps Take 2 capsules by mouth daily.   PREVAGEN PO Take by mouth daily.             OBJECTIVE:  VS:BP (!) 144/80   Ht 5' 1 (1.549 m)   Wt 112 lb (50.8 kg)   BMI 21.16 kg/m    Wt Readings from Last 3 Encounters:  12/17/22 105 lb (47.6 kg)  07/19/22 105 lb (47.6 kg)  05/28/22 113 lb 9.6 oz (51.5 kg)     EXAM: General: Pt appears well and is in NAD  Neck: General: Supple without adenopathy. Thyroid : Thyroid  size normal.  No goiter or nodules appreciated.   Lungs: Clear with good BS bilat with no rales, rhonchi, or wheezes  Heart: Auscultation: RRR.  Extremities:  BL LE: No pretibial edema normal ROM and strength.  Mental Status: Judgment, insight: Intact Orientation: Oriented to time, place, and person  Mood and affect: No depression, anxiety, or agitation     DATA REVIEWED:   Latest Reference Range & Units 09/27/21 09:30  Sodium 134 - 144 mmol/L 136  Potassium 3.5 - 5.2 mmol/L 5.4 (H)  Chloride 96 - 106 mmol/L 97  CO2 20 - 29 mmol/L 21   Glucose 70 - 99 mg/dL 93  BUN 8 - 27 mg/dL 11  Creatinine 9.42 - 8.99 mg/dL 9.32  Calcium 8.7 - 89.6 mg/dL 9.9  BUN/Creatinine Ratio 12 - 28  16  eGFR >59 mL/min/1.73 84  Alkaline Phosphatase 44 - 121 IU/L 70  Albumin 3.6 - 4.6 g/dL 4.6  Albumin/Globulin Ratio 1.2 - 2.2  1.8  AST 0 - 40 IU/L 24  ALT 0 - 32 IU/L 18  Total Protein 6.0 - 8.5 g/dL 7.1  Total Bilirubin 0.0 - 1.2 mg/dL 0.5    Latest Reference Range & Units 09/27/21 09:30  TSH 0.450 - 4.500 uIU/mL 2.840     Results:2021  Femoral neck (FN) 33% distal radius  T-score RFN: -2.5  -5.3  Change in BMD from previous DXA test (%) from 2018 Down 4% Down 17%  (*) statistically significant    ASSESSMENT/PLAN/RECOMMENDATIONS:   Osteoporosis :  - She is intolerant to alendronate due to GI side effects  - She is intolerant to Prolia due to nausea and chills  - She had declined PTH analogues due to the need to take daily injections - She tolerated her first zoledronic  acid injection in 2022, second 1 was in July, 2023 - She did have toe fractures in 2024, toe fractures are not considered fragility fractures - Will proceed with DXA scan at [facility], will determine if she needs a third dose of Reclast  infusion - Emphasized the importance of calcium intake and vitamin D  intake - Labs show normal PTH, vitamin D , GFR, and serum calcium  Medications : Calcium 650 mg daily Vitamin D  1000 iu daily through [facility]   F/U in 1 yr   Signed electronically by: Stefano Redgie Butts, MD  [facility] Endocrinology  [facility] [address] [place] [place], [place] [address] Phone: [phone] FAX: [phone]   CC: Seabron Lenis, MD [address] [place] [address] Phone: [phone] Fax: [phone]   Return to Endocrinology clinic  as below: Future Appointments  Date Time Provider Department Center  04/02/2024  1:40 PM Denvil Canning, Donell Cardinal, MD [id number] None   06/08/2024  2:30 PM Gaynel Delon CROME, DPM [facility]

## 2024-04-02 NOTE — Patient Instructions (Addendum)
 Tae calcium  mg twice daily

## 2024-04-03 LAB — BASIC METABOLIC PANEL WITH GFR
BUN: 10 mg/dL (ref 7–25)
CO2: 31 mmol/L (ref 20–32)
Calcium: 9.7 mg/dL (ref 8.6–10.4)
Chloride: 100 mmol/L (ref 98–110)
Creat: 0.6 mg/dL (ref 0.60–0.95)
Glucose, Bld: 100 mg/dL — ABNORMAL HIGH (ref 65–99)
Potassium: 4.5 mmol/L (ref 3.5–5.3)
Sodium: 138 mmol/L (ref 135–146)
eGFR: 85 mL/min/1.73m2 (ref >=60)

## 2024-04-03 LAB — VITAMIN D 25 HYDROXY (VIT D DEFICIENCY, FRACTURES): Vit D, 25-Hydroxy: 64 ng/mL (ref 30–100)

## 2024-04-03 LAB — PARATHYROID HORMONE, INTACT (NO CA): PTH: 32 pg/mL (ref 16–77)

## 2024-04-03 LAB — ALBUMIN: Albumin: 4.2 g/dL (ref 3.6–5.1)

## 2024-04-05 ENCOUNTER — Ambulatory Visit: Payer: Self-pay | Admitting: Internal Medicine

## 2024-05-15 ENCOUNTER — Other Ambulatory Visit: Payer: Self-pay | Admitting: Emergency Medicine

## 2024-06-08 ENCOUNTER — Ambulatory Visit: Admitting: Podiatry

## 2024-07-06 ENCOUNTER — Ambulatory Visit: Admitting: Podiatry

## 2025-04-04 ENCOUNTER — Ambulatory Visit: Admitting: Internal Medicine
